# Patient Record
Sex: Female | Born: 1965 | Race: White | Hispanic: No | Marital: Single | Smoking: Never smoker
Health system: Southern US, Community
[De-identification: ages and names within clinical notes are randomized; demographics above are authoritative.]

## PROBLEM LIST (undated history)

## (undated) DIAGNOSIS — I10 Essential (primary) hypertension: Secondary | ICD-10-CM

## (undated) DIAGNOSIS — F32A Depression, unspecified: Secondary | ICD-10-CM

## (undated) DIAGNOSIS — F419 Anxiety disorder, unspecified: Secondary | ICD-10-CM

## (undated) DIAGNOSIS — E038 Other specified hypothyroidism: Secondary | ICD-10-CM

## (undated) DIAGNOSIS — E785 Hyperlipidemia, unspecified: Secondary | ICD-10-CM

## (undated) DIAGNOSIS — F329 Major depressive disorder, single episode, unspecified: Secondary | ICD-10-CM

## (undated) DIAGNOSIS — E039 Hypothyroidism, unspecified: Secondary | ICD-10-CM

## (undated) HISTORY — PX: TUBAL LIGATION: SHX77

## (undated) HISTORY — DX: Hyperlipidemia, unspecified: E78.5

## (undated) HISTORY — PX: SHOULDER SURGERY: SHX246

## (undated) HISTORY — DX: Depression, unspecified: F32.A

## (undated) HISTORY — DX: Essential (primary) hypertension: I10

## (undated) HISTORY — DX: Other specified hypothyroidism: E03.8

## (undated) HISTORY — DX: Hypothyroidism, unspecified: E03.9

## (undated) HISTORY — DX: Major depressive disorder, single episode, unspecified: F32.9

## (undated) HISTORY — DX: Anxiety disorder, unspecified: F41.9

---

## 1999-06-29 ENCOUNTER — Encounter: Payer: Self-pay | Admitting: Emergency Medicine

## 1999-06-29 ENCOUNTER — Emergency Department (HOSPITAL_COMMUNITY): Admission: EM | Admit: 1999-06-29 | Discharge: 1999-06-29 | Payer: Self-pay | Admitting: Emergency Medicine

## 1999-07-02 ENCOUNTER — Inpatient Hospital Stay (HOSPITAL_COMMUNITY): Admission: RE | Admit: 1999-07-02 | Discharge: 1999-07-02 | Payer: Self-pay | Admitting: Orthopedic Surgery

## 1999-07-02 ENCOUNTER — Encounter: Payer: Self-pay | Admitting: Orthopedic Surgery

## 2001-09-17 ENCOUNTER — Emergency Department (HOSPITAL_COMMUNITY): Admission: EM | Admit: 2001-09-17 | Discharge: 2001-09-17 | Payer: Self-pay | Admitting: Emergency Medicine

## 2001-10-03 ENCOUNTER — Emergency Department (HOSPITAL_COMMUNITY): Admission: EM | Admit: 2001-10-03 | Discharge: 2001-10-03 | Payer: Self-pay | Admitting: *Deleted

## 2001-11-09 ENCOUNTER — Encounter: Payer: Self-pay | Admitting: Emergency Medicine

## 2001-11-09 ENCOUNTER — Emergency Department (HOSPITAL_COMMUNITY): Admission: EM | Admit: 2001-11-09 | Discharge: 2001-11-09 | Payer: Self-pay | Admitting: Emergency Medicine

## 2002-10-23 ENCOUNTER — Ambulatory Visit (HOSPITAL_COMMUNITY): Admission: RE | Admit: 2002-10-23 | Discharge: 2002-10-23 | Payer: Self-pay | Admitting: *Deleted

## 2003-01-24 ENCOUNTER — Inpatient Hospital Stay (HOSPITAL_COMMUNITY): Admission: AD | Admit: 2003-01-24 | Discharge: 2003-01-24 | Payer: Self-pay | Admitting: *Deleted

## 2003-03-10 ENCOUNTER — Inpatient Hospital Stay (HOSPITAL_COMMUNITY): Admission: AD | Admit: 2003-03-10 | Discharge: 2003-03-14 | Payer: Self-pay | Admitting: Obstetrics & Gynecology

## 2006-09-10 ENCOUNTER — Emergency Department (HOSPITAL_COMMUNITY): Admission: EM | Admit: 2006-09-10 | Discharge: 2006-09-10 | Payer: Self-pay | Admitting: Emergency Medicine

## 2007-04-27 ENCOUNTER — Ambulatory Visit (HOSPITAL_COMMUNITY): Admission: RE | Admit: 2007-04-27 | Discharge: 2007-04-27 | Payer: Self-pay | Admitting: Family Medicine

## 2007-06-19 ENCOUNTER — Ambulatory Visit (HOSPITAL_COMMUNITY): Admission: RE | Admit: 2007-06-19 | Discharge: 2007-06-19 | Payer: Self-pay | Admitting: Family Medicine

## 2007-07-05 ENCOUNTER — Ambulatory Visit: Payer: Self-pay | Admitting: Obstetrics & Gynecology

## 2007-07-12 ENCOUNTER — Ambulatory Visit: Payer: Self-pay | Admitting: *Deleted

## 2007-07-16 ENCOUNTER — Ambulatory Visit (HOSPITAL_COMMUNITY): Admission: RE | Admit: 2007-07-16 | Discharge: 2007-07-16 | Payer: Self-pay | Admitting: Family Medicine

## 2007-07-16 ENCOUNTER — Ambulatory Visit: Payer: Self-pay | Admitting: Obstetrics & Gynecology

## 2007-07-19 ENCOUNTER — Ambulatory Visit: Payer: Self-pay | Admitting: Obstetrics & Gynecology

## 2007-07-23 ENCOUNTER — Ambulatory Visit (HOSPITAL_COMMUNITY): Admission: RE | Admit: 2007-07-23 | Discharge: 2007-07-23 | Payer: Self-pay | Admitting: Family Medicine

## 2007-07-23 ENCOUNTER — Ambulatory Visit: Payer: Self-pay | Admitting: Obstetrics & Gynecology

## 2007-07-26 ENCOUNTER — Ambulatory Visit: Payer: Self-pay | Admitting: *Deleted

## 2007-08-06 ENCOUNTER — Ambulatory Visit (HOSPITAL_COMMUNITY): Admission: RE | Admit: 2007-08-06 | Discharge: 2007-08-06 | Payer: Self-pay | Admitting: Family Medicine

## 2007-08-09 ENCOUNTER — Ambulatory Visit: Payer: Self-pay | Admitting: Family Medicine

## 2007-08-10 ENCOUNTER — Ambulatory Visit: Payer: Self-pay | Admitting: Obstetrics and Gynecology

## 2007-08-14 ENCOUNTER — Inpatient Hospital Stay (HOSPITAL_COMMUNITY): Admission: AD | Admit: 2007-08-14 | Discharge: 2007-08-17 | Payer: Self-pay | Admitting: Obstetrics & Gynecology

## 2007-08-14 ENCOUNTER — Ambulatory Visit: Payer: Self-pay | Admitting: Obstetrics and Gynecology

## 2007-08-15 ENCOUNTER — Encounter: Payer: Self-pay | Admitting: *Deleted

## 2007-08-16 ENCOUNTER — Encounter (INDEPENDENT_AMBULATORY_CARE_PROVIDER_SITE_OTHER): Payer: Self-pay | Admitting: Gynecology

## 2007-08-17 ENCOUNTER — Ambulatory Visit: Payer: Self-pay | Admitting: Physician Assistant

## 2007-08-17 ENCOUNTER — Inpatient Hospital Stay (HOSPITAL_COMMUNITY): Admission: AD | Admit: 2007-08-17 | Discharge: 2007-08-17 | Payer: Self-pay | Admitting: Family Medicine

## 2009-02-04 IMAGING — US US OB FOLLOW-UP
3 series · 14 of 28 positions shown · non-contrast
Comparison: none

OBSTETRICAL ULTRASOUND:
 This ultrasound was performed in The [HOSPITAL], and the [facility] report will be stored to [REDACTED] PACS.

[Series 1: us ob follow-up · 1 of 4 slices shown (1 of 3)]
[im 2/4]
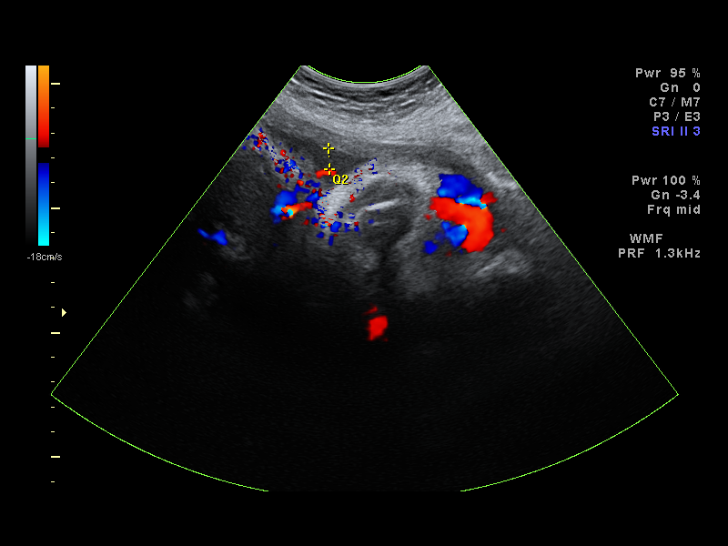

[Series 1: us ob follow-up · 12 of 30 slices shown (2 of 3)]
[im 1/30]
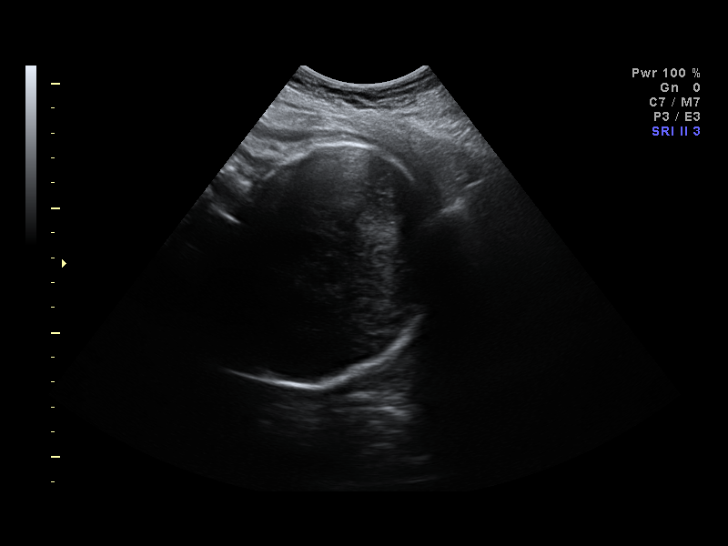
[im 3/30]
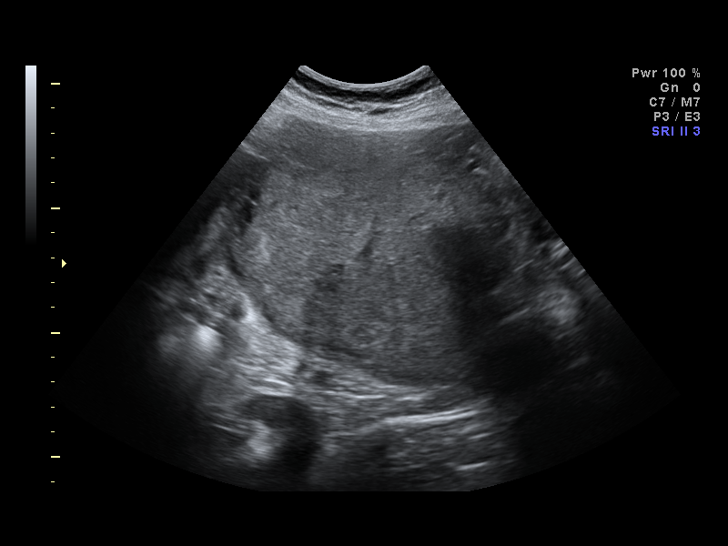
[im 6/30]
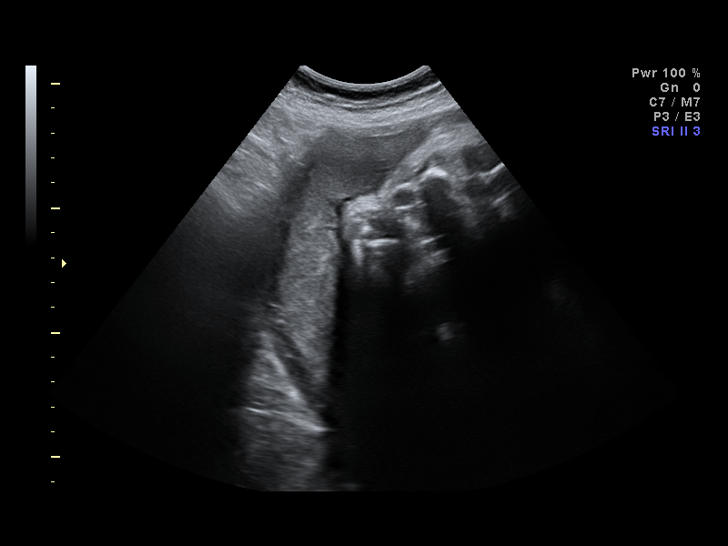
[im 8/30]
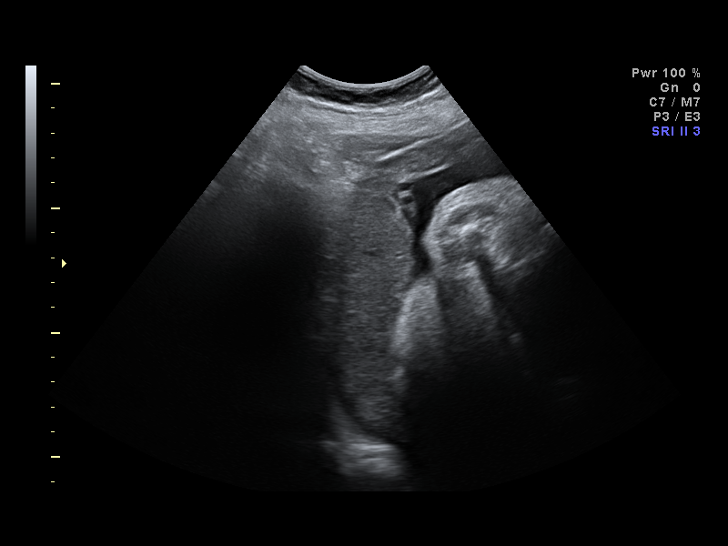
[im 11/30]
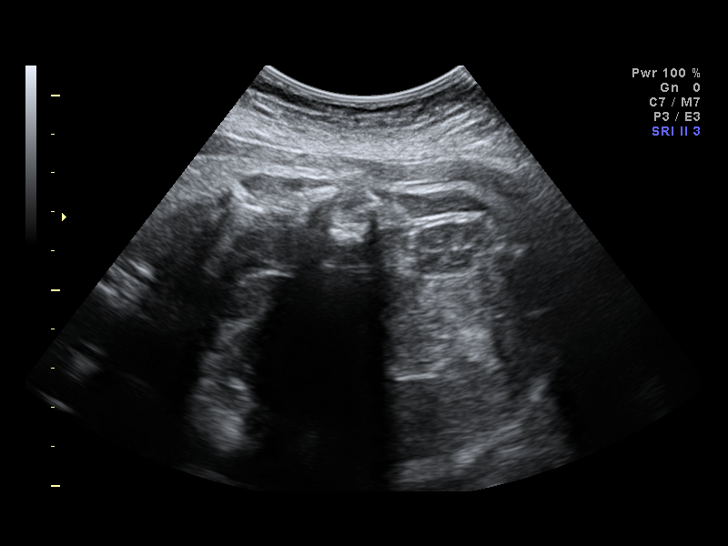
[im 14/30]
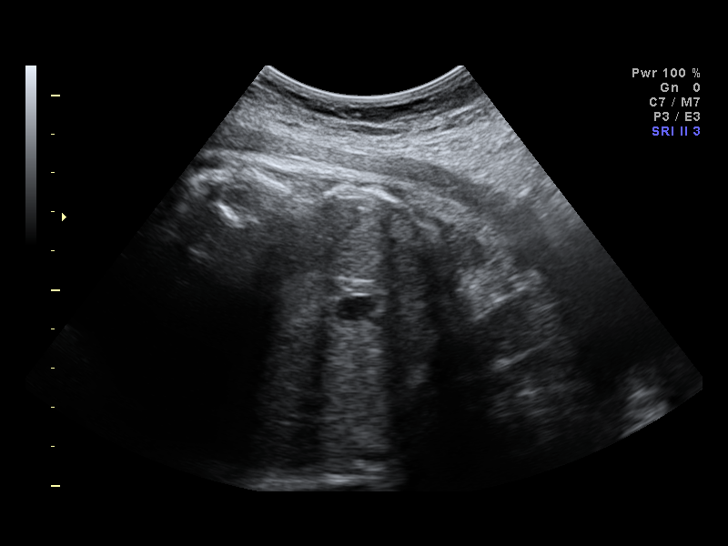
[im 16/30]
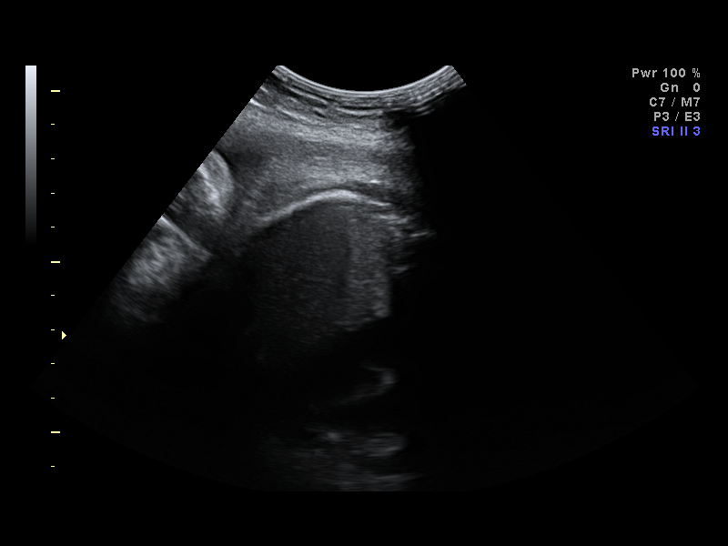
[im 19/30]
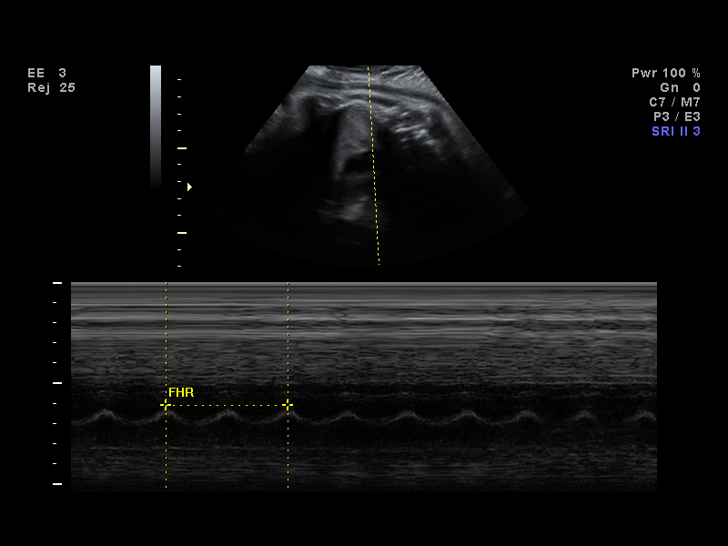
[im 22/30]
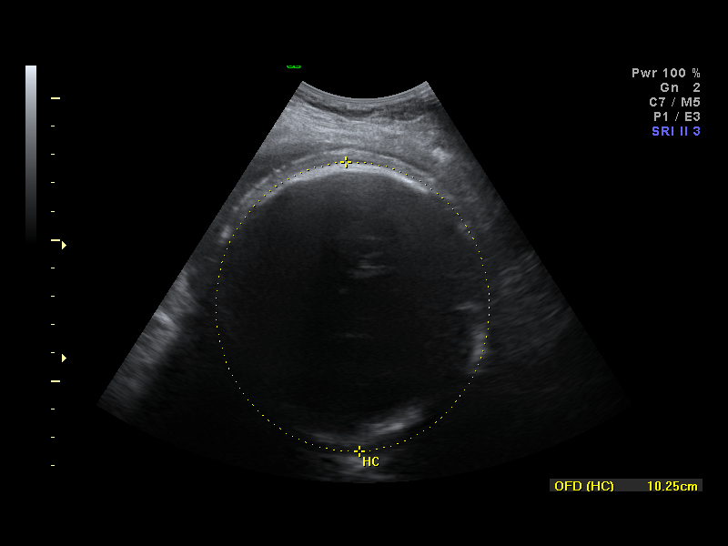
[im 24/30]
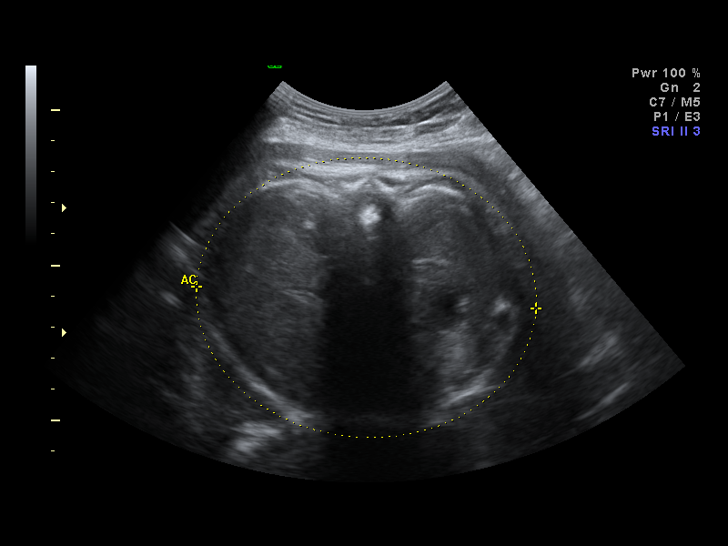
[im 27/30]
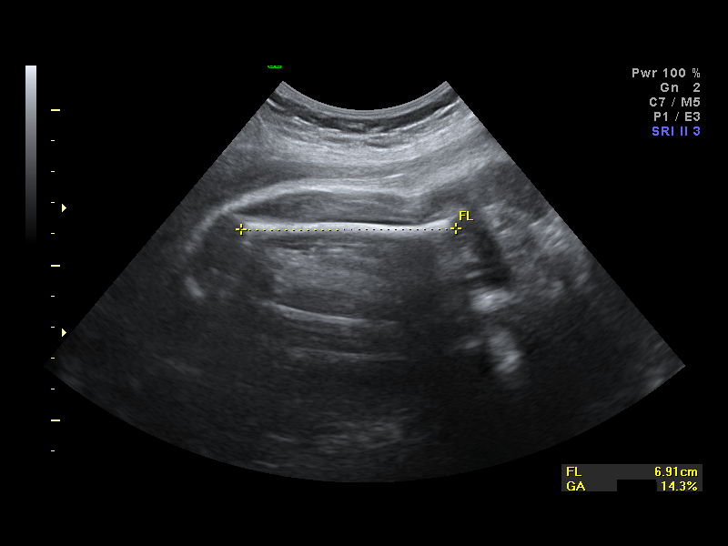
[im 30/30]
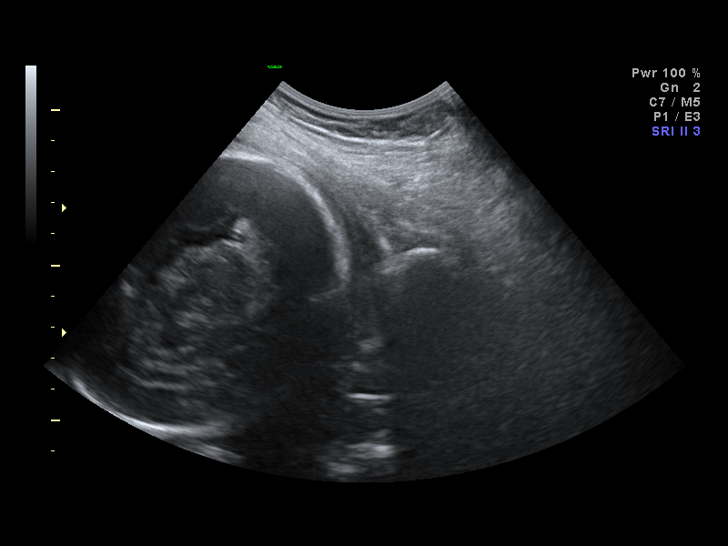

[Series 1: us ob follow-up · 1 of 3 slices shown (3 of 3)]
[im 3/3]
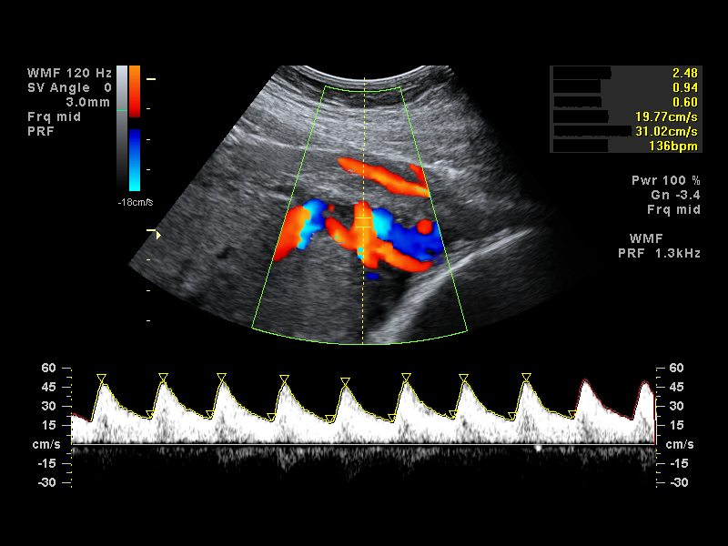

[14 of 28 positions shown; findings below may reference images not displayed]

IMPRESSION: [facility] has also been faxed to the ordering physician.

## 2009-09-06 ENCOUNTER — Emergency Department (HOSPITAL_COMMUNITY): Admission: EM | Admit: 2009-09-06 | Discharge: 2009-09-06 | Payer: Self-pay | Admitting: Emergency Medicine

## 2009-12-10 ENCOUNTER — Encounter: Admission: RE | Admit: 2009-12-10 | Discharge: 2009-12-10 | Payer: Self-pay | Admitting: Family Medicine

## 2010-03-21 ENCOUNTER — Emergency Department (HOSPITAL_COMMUNITY): Admission: EM | Admit: 2010-03-21 | Discharge: 2010-03-21 | Payer: Self-pay | Admitting: Emergency Medicine

## 2010-05-30 ENCOUNTER — Encounter: Payer: Self-pay | Admitting: *Deleted

## 2010-06-20 ENCOUNTER — Inpatient Hospital Stay (INDEPENDENT_AMBULATORY_CARE_PROVIDER_SITE_OTHER)
Admission: RE | Admit: 2010-06-20 | Discharge: 2010-06-20 | Disposition: A | Discharge status: Home / Self Care | Payer: Medicaid Other | Source: Ambulatory Visit | Attending: Emergency Medicine | Admitting: Emergency Medicine

## 2010-06-20 DIAGNOSIS — L509 Urticaria, unspecified: Secondary | ICD-10-CM

## 2010-09-21 NOTE — Discharge Summary (Signed)
[name], [name]                ACCOUNT NO.:  [id number]   MEDICAL RECORD NO.:  [id number]          PATIENT TYPE:  INP   LOCATION:  9372                          FACILITY:  [facility]   PHYSICIAN:  Allie Bossier, MD        DATE OF BIRTH:  1965-10-27   DATE OF ADMISSION:  08/14/2007  DATE OF DISCHARGE:  08/17/2007                               DISCHARGE SUMMARY   ADMISSION DIAGNOSES:  1. Intrauterine pregnancy at 36 weeks 6 days gestation.  2. Right upper quadrant pain.  3. Elevated diastolic blood pressures in the 90s.  4. History of pregnancy-induced hypertension in her last pregnancy.   DISCHARGE DIAGNOSES:  1. Postpartum day #1, spontaneous vaginal delivery of viable infant      female.  2. Postoperative day #1, bilateral tubal ligation.  3. Induction of labor for gestational hypertension and      oligohydramnios.  4. Postpartum hypertension, now on thiazide diuretic.   PROCEDURES:  1. [name] had an obstetric ultrasound performed on August 14, 2007,      for biophysical profile showing single intrauterine gestation in      cephalic presentation.  Placenta was posterior by the cervical os.      Biophysical profile was at 8.  Amniotic fluid index was 9.3.  2. She had a repeat ultrasound, complete OB ultrasound with MFM on      August 15, 2007, showing single gestation in cephalic presentation      with placenta right lateral above the cervical os.  Amniotic fluid      index was 6.65, this is of third percentile.  Estimated fetal      weight was 2689 g in 34th percentile.  3. The patient had a complete abdominal ultrasound for right upper      quadrant pain showing no gallbladder, liver, or pancreatic      abnormalities.  There was a 2-cm cyst in the upper pole of the left      kidney.   PROCEDURES:  The patient had a postpartum bilateral tubal ligation  performed by Dr. Mia Creek on August 16, 2007.   COMPLICATIONS:  The patient was admitted to the AICU for postpartum  magnesium  administration due to her elevated blood pressures and  symptoms consistent with preeclampsia including the right upper quadrant  pain.   PERTINENT LABORATORY FINDINGS:  Upon admission, [name] had a white  blood cell count of 10.9, hemoglobin 12.2, hematocrit 34.7, and  platelets 232.  Urinalysis was negative.  CMP showed sodium 136,  potassium 4.1, chloride 108, glucose 95, BUN 10, creatinine 0.73, AST  26, ALT 23, LDH was 114, and uric acid was 5.7.  On hospital day #2, the  patient had repeat PIH labs with hemoglobin 11.4, platelets 208, AST 17,  ALT 18, LDH was 74.  She had 24-hour urine collected that had a  creatinine clearance of 94, urine volume of 925, and total protein of  93.  On postpartum day #1, she had a white blood cell count of 16.9,  hemoglobin 10.3, platelets 196.  RPR  was nonreactive.   BRIEF PERTINENT ADMISSION HISTORY:  [name] is a 45 year old gravida  3, para 0-1-1-1 presenting to the MAU with right upper quadrant pain.  She also had elevated blood pressures but denied other symptoms of  preeclampsia.  She was evaluated with obstetrical ultrasound and found  to have a biophysical profile of 8/8 with an amniotic fluid index of  9.3.  Due to concern of her preeclampsia or possible gallbladder  disease, the patient was admitted for evaluation.   HOSPITAL COURSE:  The patient was admitted to the antenatal unit.  Gallbladder ultrasound was performed that showed no gallbladder, liver,  or pancreatic abnormality or pathology.  Her pressures were monitored  throughout her stay and they ranged from 139-161 systolic over 74-95  diastolic.  On hospital day #2, she had a repeat ultrasound showing  oligohydramnios with an AFI of 6.65.  Due to her elevated pressures and  the oligohydramnios, the patient was transferred to labor and delivery  for induction of labor.  Her 24-hour urine was not back at this time but  when it did return the protein was 93.  The patient did  progress to  spontaneous vaginal delivery of viable infant female weighing 5 pounds 7  ounces with Apgars 9 at 1 minute and 9 at 5 minutes on August 15, 2007, at  21:16 in the evening.  It was an uncomplicated delivery.  First-degree  perineal laceration was repaired with 3-0 Vicryl.  She was transferred  to the [facility] for continued magnesium supplementation which was begun when  she was induced.  On postpartum day #1, she had bilateral tubal ligation  performed by Dr. Mia Creek and tolerated that well.  On postpartum day  #2, her pressures were down to the 120s-140s range systolic 60s to 90s  diastolic.  She had been started on labetalol 200 b.i.d.  This was  switched to hydrochlorothiazide 12.5 mg before discharge.  She had no  signs or symptoms of preeclampsia.  She is doing well postoperatively.  She had mild tenderness without peritoneal signs.  She is ready for  discharge on postpartum day #2.  The baby was to stay due to decreased  weight and inability to breast-feed.  The mother was going to remain  with the baby.   DISCHARGE STATUS:  Stable.   DISCHARGE MEDICATIONS:  1. Continue prenatal vitamins 1 tablet daily.  2. Motrin 600 mg 1 tablet by mouth every 6 hours as needed for pain.  3. Percocet 5/25 1 tablet by mouth every 6 hours as needed for pain.  4. Ambien 5 mg 1 tablet by mouth at bedtime as needed for insomnia,      dispensed #5.  5. Colace 100 mg 1 tablet by mouth twice daily.  6. Hydrochlorothiazide 12.5 mg 1 tablet by mouth once daily.   DISCHARGE INSTRUCTIONS:  1. Discharged home.  2. No sexual activity for 6 weeks.  3. No lifting greater than 10 pounds for 6 weeks.  4. The patient is to have baby and nurse follow in 1 week to check      blood pressure.  She is to follow with the physician if her blood      pressures is greater than 140/90.  5. The patient is to follow at 6-week postpartum check at the health      department.      Karlton Lemon, MD   Electronically Signed     ______________________________  Allie Bossier, MD  NS/MEDQ  D:  08/17/2007  T:  08/18/2007  Job:  [id number]

## 2010-09-21 NOTE — Op Note (Signed)
NAMEHAELI, [name]                ACCOUNT NO.:  [id number]   MEDICAL RECORD NO.:  [id number]           PATIENT TYPE:   LOCATION:  MFM                           FACILITY:  [facility]   PHYSICIAN:  Ginger Carne, MD  DATE OF BIRTH:  Nov 07, 1965   DATE OF PROCEDURE:  DATE OF DISCHARGE:  08/17/2007                               OPERATIVE REPORT   PREOPERATIVE DIAGNOSIS:  Sterilization postpartum.   POSTOPERATIVE DIAGNOSIS:  Sterilization postpartum.   PROCEDURE:  [place] bilateral postpartum tubal ligation.   SURGEON:  Dr. Mia Creek.   ASSISTANT:  None.   COMPLICATIONS:  None immediate.   ESTIMATED BLOOD LOSS:  Minimal.   ANESTHESIA:  Epidural top off.   SPECIMEN:  Portions of right and left tubes disposed to pathology.   OPERATIVE FINDINGS:  Uterus, tubes and ovaries showed normal decidual  changes of pregnancy.  Both tubes were identified from their isthmus to  fimbriated ends separate and apart from their respective round  ligaments.   OPERATIVE PROCEDURE:  The patient was prepped and draped in the usual  fashion and placed in supine position.  Betadine solution used for  antiseptic and the patient was catheterized prior to the procedure.  Afterwards a vertical infraumbilical incision was made and the abdomen  opened.  Both tubes were identified from their isthmus to fimbriated  ends separate and apart from their respective round ligaments.  3 cm of  tubes were incorporated between 2-0 plain catgut suture.  The first  suture was affixed to the proximal portion of said tubes. The tubes cut  above the knots and tips cauterized, no active bleeding noted.  The  fascia closed in one layer of #0 Vicryl running suture and 3-0 Monocryl  for subcuticular closure.  Instrument and sponge count were correct.  The patient tolerated the procedure well and returned to the post  anesthesia recovery room in excellent condition.      Ginger Carne, MD  Electronically Signed     SHB/MEDQ  D:  08/16/2007  T:  08/16/2007  Job:  [id number]

## 2010-12-29 ENCOUNTER — Other Ambulatory Visit: Payer: Self-pay | Admitting: Family Medicine

## 2010-12-29 DIAGNOSIS — Z1231 Encounter for screening mammogram for malignant neoplasm of breast: Secondary | ICD-10-CM

## 2011-01-06 ENCOUNTER — Ambulatory Visit: Payer: Medicaid Other

## 2011-01-06 ENCOUNTER — Ambulatory Visit
Admission: RE | Admit: 2011-01-06 | Discharge: 2011-01-06 | Disposition: A | Discharge status: Home / Self Care | Payer: Medicaid Other | Source: Ambulatory Visit | Attending: Family Medicine | Admitting: Family Medicine

## 2011-01-06 DIAGNOSIS — Z1231 Encounter for screening mammogram for malignant neoplasm of breast: Secondary | ICD-10-CM

## 2011-01-28 LAB — POCT URINALYSIS DIP (DEVICE)
Bilirubin Urine: NEGATIVE
Glucose, UA: NEGATIVE
Nitrite: NEGATIVE

## 2011-01-31 LAB — POCT URINALYSIS DIP (DEVICE)
Bilirubin Urine: NEGATIVE
Bilirubin Urine: NEGATIVE
Glucose, UA: NEGATIVE
Glucose, UA: NEGATIVE
Glucose, UA: NEGATIVE
Nitrite: NEGATIVE
Nitrite: NEGATIVE
Nitrite: NEGATIVE
Operator id: 15968
Operator id: 297281
Operator id: 148111
pH: 6
pH: 5

## 2011-02-01 LAB — CBC
HCT: 32.5 — ABNORMAL LOW
Hemoglobin: 11.4 — ABNORMAL LOW
MCHC: 34.9
MCHC: 35
MCV: 93.1
MCV: 94.5
Platelets: 208
Platelets: 232
RDW: 13.3
RDW: 13.5
RDW: 13.6
WBC: 10.8 — ABNORMAL HIGH

## 2011-02-01 LAB — COMPREHENSIVE METABOLIC PANEL
ALT: 23
AST: 26
Calcium: 8.9
GFR calc Af Amer: >60
Sodium: 136
Total Protein: 5.2 — ABNORMAL LOW

## 2011-02-01 LAB — HEPATIC FUNCTION PANEL
ALT: 18
AST: 17
Alkaline Phosphatase: 111
Bilirubin, Direct: 0.1
Indirect Bilirubin: 0.3
Total Bilirubin: 0.4

## 2011-02-01 LAB — URINALYSIS, ROUTINE W REFLEX MICROSCOPIC
Bilirubin Urine: NEGATIVE
Ketones, ur: NEGATIVE
Nitrite: NEGATIVE
Specific Gravity, Urine: 1.02
Urobilinogen, UA: 0.2

## 2011-02-01 LAB — POCT URINALYSIS DIP (DEVICE)
Bilirubin Urine: NEGATIVE
Nitrite: NEGATIVE
pH: 6

## 2011-02-01 LAB — CREATININE CLEARANCE, URINE, 24 HOUR
Collection Interval-CRCL: 24
Creatinine Clearance: 94
Creatinine, Urine: 107.1

## 2011-02-01 LAB — PROTEIN, URINE, 24 HOUR: Urine Total Volume-UPROT: 925

## 2011-02-01 LAB — RPR: RPR Ser Ql: NONREACTIVE

## 2011-10-20 ENCOUNTER — Ambulatory Visit: Payer: Medicaid Other | Attending: Orthopaedic Surgery | Admitting: Rehabilitation

## 2011-11-08 ENCOUNTER — Ambulatory Visit: Payer: Medicaid Other | Attending: Orthopaedic Surgery | Admitting: Physical Therapy

## 2011-11-08 DIAGNOSIS — R262 Difficulty in walking, not elsewhere classified: Secondary | ICD-10-CM | POA: Insufficient documentation

## 2011-11-08 DIAGNOSIS — IMO0001 Reserved for inherently not codable concepts without codable children: Secondary | ICD-10-CM | POA: Insufficient documentation

## 2011-11-08 DIAGNOSIS — M25569 Pain in unspecified knee: Secondary | ICD-10-CM | POA: Insufficient documentation

## 2011-11-23 ENCOUNTER — Encounter: Payer: Medicaid Other | Admitting: Physical Therapy

## 2011-11-30 ENCOUNTER — Encounter: Payer: Medicaid Other | Admitting: Physical Therapy

## 2011-12-07 ENCOUNTER — Encounter: Payer: Medicaid Other | Admitting: Physical Therapy

## 2012-06-11 ENCOUNTER — Other Ambulatory Visit: Payer: Self-pay | Admitting: Family Medicine

## 2012-06-11 DIAGNOSIS — Z1231 Encounter for screening mammogram for malignant neoplasm of breast: Secondary | ICD-10-CM

## 2012-07-09 ENCOUNTER — Ambulatory Visit: Payer: Medicaid Other

## 2012-08-23 ENCOUNTER — Telehealth: Payer: Self-pay | Admitting: Family Medicine

## 2012-08-23 NOTE — Telephone Encounter (Signed)
Called patient and left mess that provider wants to see to address medications.  Told to call back and schedule appointment.

## 2012-08-23 NOTE — Telephone Encounter (Signed)
Needs to be seen to address.

## 2012-12-25 ENCOUNTER — Ambulatory Visit: Payer: Self-pay | Admitting: Family Medicine

## 2012-12-27 ENCOUNTER — Encounter: Payer: Self-pay | Admitting: Family Medicine

## 2012-12-27 ENCOUNTER — Ambulatory Visit (INDEPENDENT_AMBULATORY_CARE_PROVIDER_SITE_OTHER): Payer: Medicaid Other | Admitting: Family Medicine

## 2012-12-27 VITALS — BP 142/98 | HR 72 | Temp 98.0°F | Resp 16 | Wt 141.0 lb

## 2012-12-27 DIAGNOSIS — F419 Anxiety disorder, unspecified: Secondary | ICD-10-CM | POA: Insufficient documentation

## 2012-12-27 DIAGNOSIS — G4700 Insomnia, unspecified: Secondary | ICD-10-CM

## 2012-12-27 DIAGNOSIS — N912 Amenorrhea, unspecified: Secondary | ICD-10-CM

## 2012-12-27 DIAGNOSIS — K219 Gastro-esophageal reflux disease without esophagitis: Secondary | ICD-10-CM

## 2012-12-27 DIAGNOSIS — I10 Essential (primary) hypertension: Secondary | ICD-10-CM

## 2012-12-27 DIAGNOSIS — F329 Major depressive disorder, single episode, unspecified: Secondary | ICD-10-CM | POA: Insufficient documentation

## 2012-12-27 DIAGNOSIS — E785 Hyperlipidemia, unspecified: Secondary | ICD-10-CM | POA: Insufficient documentation

## 2012-12-27 LAB — PREGNANCY, URINE: Preg Test, Ur: NEGATIVE

## 2012-12-27 MED ORDER — TRAZODONE HCL 50 MG PO TABS: 25.0000 mg | ORAL_TABLET | Freq: Every evening | ORAL | Status: DC | PRN

## 2012-12-27 MED ORDER — LOSARTAN POTASSIUM 50 MG PO TABS: 50.0000 mg | ORAL_TABLET | Freq: Every day | ORAL | Status: DC

## 2012-12-27 MED ORDER — PANTOPRAZOLE SODIUM 40 MG PO TBEC: 40.0000 mg | DELAYED_RELEASE_TABLET | Freq: Every day | ORAL | Status: DC

## 2012-12-27 NOTE — Progress Notes (Signed)
  Subjective:    Patient ID: [name], female    DOB: [birth year], 47 y.o.   MRN: [id number]  HPI Patient has a history of hypertension. She is currently on hydrochlorothiazide 25 mg by mouth daily. She denies any chest pain shortness of breath or dyspnea on exertion. Her blood pressure continues to remain elevated. She also has hyperlipidemia on Zocor 20 mg by mouth daily. She denies myalgias, right upper quadrant pain. She continues to have problems with hot flashes. She has not had a period in 2 months. Prior to that she was having irregular periods which were very light. She is interested to see if she may be menopausal. She denies any intermenstrual bleeding.  She also reports severe reflux. It frequently awakens her from sleep. It is a substernal burning sensation. It is unrelieved by TUMS or Zantac. Past Medical History  Diagnosis Date  . Hypertension   . Hyperlipidemia   . Anxiety   . Depression    Past Surgical History  Procedure Laterality Date  . Tubal ligation     No current outpatient prescriptions on file prior to visit.   No current facility-administered medications on file prior to visit.   No Known Allergies History   Social History  . Marital Status: Single    Spouse Name: N/A    Number of Children: N/A  . Years of Education: N/A   Occupational History  . Not on file.   Social History Main Topics  . Smoking status: Never Smoker   . Smokeless tobacco: Not on file  . Alcohol Use: No  . Drug Use: No  . Sexual Activity: Not on file   Other Topics Concern  . Not on file   Social History Narrative  . No narrative on file     Review of Systems  All other systems reviewed and are negative.       Objective:   Physical Exam  Vitals reviewed. Cardiovascular: Normal rate, regular rhythm and normal heart sounds.   No murmur heard. Pulmonary/Chest: Effort normal and breath sounds normal. No respiratory distress. She has no wheezes. She has no rales.   Abdominal: Soft. Bowel sounds are normal. She exhibits no distension. There is no tenderness. There is no rebound and no guarding.          Assessment & Plan:  1. Amenorrhea Pregnancy test is negative. All check an FSH and LH to see if she may be menopausal. She is likely at least perimenopausal - Pregnancy, urine - Follicle stimulating hormone - Luteinizing hormone  2. HTN (hypertension) At losartan 50 mg by mouth daily. Recheck blood pressure in one month. Return fasting for a fasting lipid panel the - losartan (COZAAR) 50 MG tablet; Take 1 tablet (50 mg total) by mouth daily.  Dispense: 90 tablet; Refill: 3  3. Insomnia Try trazodone 50 mg by mouth each bedtime when necessary insomnia - traZODone (DESYREL) 50 MG tablet; Take 0.5-1 tablets (25-50 mg total) by mouth at bedtime as needed for sleep.  Dispense: 30 tablet; Refill: 3  4. GERD (gastroesophageal reflux disease) - pantoprazole (PROTONIX) 40 MG tablet; Take 1 tablet (40 mg total) by mouth daily.  Dispense: 30 tablet; Refill: 3

## 2013-03-27 ENCOUNTER — Other Ambulatory Visit: Payer: Self-pay

## 2013-03-27 DIAGNOSIS — Z1231 Encounter for screening mammogram for malignant neoplasm of breast: Secondary | ICD-10-CM

## 2013-04-09 ENCOUNTER — Encounter: Payer: Self-pay | Admitting: Family Medicine

## 2013-04-09 NOTE — Telephone Encounter (Signed)
[phone] call back number Pt is wondering since her Medicaid insurance has sent her something about having her mammogram done by January is that going to be okay to still do it on her apt on January 6th here at the office She would like to have someone call her back because she has also scheduled to have her mammogram done some where else

## 2013-04-12 NOTE — Telephone Encounter (Signed)
This encounter was created in error - please disregard.

## 2013-04-17 ENCOUNTER — Ambulatory Visit: Payer: Medicaid Other

## 2013-05-14 ENCOUNTER — Other Ambulatory Visit: Payer: Medicaid Other | Admitting: Family Medicine

## 2013-07-23 ENCOUNTER — Other Ambulatory Visit: Payer: Self-pay | Admitting: Family Medicine

## 2013-07-23 NOTE — Telephone Encounter (Signed)
One refill given  CPE next week

## 2013-07-30 ENCOUNTER — Other Ambulatory Visit: Payer: Medicaid Other | Admitting: Family Medicine

## 2013-09-17 ENCOUNTER — Other Ambulatory Visit: Payer: Self-pay | Admitting: Family Medicine

## 2013-09-17 NOTE — Telephone Encounter (Signed)
?   OK to Refill  

## 2013-09-17 NOTE — Telephone Encounter (Signed)
Refused, ntbs

## 2013-09-17 NOTE — Telephone Encounter (Signed)
Noted  

## 2013-09-20 ENCOUNTER — Other Ambulatory Visit: Payer: Self-pay | Admitting: Family Medicine

## 2013-09-20 NOTE — Telephone Encounter (Signed)
?   OK to Refill  

## 2013-09-23 NOTE — Telephone Encounter (Signed)
Denied, NTBS

## 2013-10-17 ENCOUNTER — Encounter: Payer: Medicaid Other | Admitting: Family Medicine

## 2013-10-29 ENCOUNTER — Encounter: Payer: Self-pay | Admitting: Family Medicine

## 2013-10-29 ENCOUNTER — Other Ambulatory Visit: Payer: Self-pay | Admitting: Family Medicine

## 2013-10-29 ENCOUNTER — Ambulatory Visit (INDEPENDENT_AMBULATORY_CARE_PROVIDER_SITE_OTHER): Payer: Medicaid Other | Admitting: Family Medicine

## 2013-10-29 VITALS — BP 146/90 | HR 78 | Temp 97.3°F | Resp 18 | Ht 63.0 in | Wt 144.0 lb

## 2013-10-29 DIAGNOSIS — Z0000 Encounter for general adult medical examination without abnormal findings: Secondary | ICD-10-CM

## 2013-10-29 DIAGNOSIS — L259 Unspecified contact dermatitis, unspecified cause: Secondary | ICD-10-CM

## 2013-10-29 DIAGNOSIS — E785 Hyperlipidemia, unspecified: Secondary | ICD-10-CM

## 2013-10-29 DIAGNOSIS — I10 Essential (primary) hypertension: Secondary | ICD-10-CM

## 2013-10-29 MED ORDER — MOMETASONE FUROATE 0.1 % EX OINT: TOPICAL_OINTMENT | Freq: Every day | CUTANEOUS | Status: DC

## 2013-10-29 NOTE — Progress Notes (Signed)
Subjective:    Patient ID: [name], female    DOB: 1966/03/06, 48 y.o.   MRN: [id number]  HPI Patient is here today for complete physical exam she is also complaining of decreased hearing in her right ear. Decreased hearing began one day ago. She is also having a rash all over her body. Is primarily in the bilateral axilla and on both eyes. It is primarily in sun exposed areas were she is applying a new suntan lotion. The rash is extremely itchy. It is not on her face. It is not on her abdomen or in her pelvic region. It appears to be a contact dermatitis. Her blood pressure today in the office is elevated. However she is been consistently checking her blood pressures home. The base of ranging 120 130/60-80. She denies any chest pain short of breath or dyspnea on exertion. She also denies any myalgias right quadrant pain. She is requesting a refill on trazodone for insomnia. She is scheduled for mammogram for August. Her last Pap smear was last year and was normal. She has no previous history of an abnormal Pap smear. Currently she is on a every 3 year screening cycle. Past Medical History  Diagnosis Date  . Hypertension   . Hyperlipidemia   . Anxiety   . Depression    Past Surgical History  Procedure Laterality Date  . Tubal ligation     Current Outpatient Prescriptions on File Prior to Visit  Medication Sig Dispense Refill  . hydrochlorothiazide (HYDRODIURIL) 25 MG tablet Take 25 mg by mouth daily.      Marland Kitchen. ibuprofen (ADVIL,MOTRIN) 800 MG tablet Take 800 mg by mouth every 8 (eight) hours as needed for pain.      Marland Kitchen. losartan (COZAAR) 50 MG tablet Take 1 tablet (50 mg total) by mouth daily.  90 tablet  3  . simvastatin (ZOCOR) 20 MG tablet Take 20 mg by mouth every evening.      . traZODone (DESYREL) 50 MG tablet TAKE 1/2 TO 1 TABLET BY MOUTH EVERY NIGHT AT BEDTIME AS NEEDED FOR SLEEP  30 tablet  0   No current facility-administered medications on file prior to visit.   No Known  Allergies History   Social History  . Marital Status: Single    Spouse Name: N/A    Number of Children: N/A  . Years of Education: N/A   Occupational History  . Not on file.   Social History Main Topics  . Smoking status: Never Smoker   . Smokeless tobacco: Not on file  . Alcohol Use: No  . Drug Use: No  . Sexual Activity: Not on file   Other Topics Concern  . Not on file   Social History Narrative  . No narrative on file   No family history on file.    Review of Systems  All other systems reviewed and are negative.      Objective:   Physical Exam  Vitals reviewed. Constitutional: She is oriented to person, place, and time. She appears well-developed and well-nourished. No distress.  HENT:  Head: Normocephalic and atraumatic.  Right Ear: External ear normal.  Left Ear: External ear normal.  Nose: Nose normal.  Mouth/Throat: Oropharynx is clear and moist. No oropharyngeal exudate.  Eyes: Conjunctivae and EOM are normal. Pupils are equal, round, and reactive to light. Right eye exhibits no discharge. Left eye exhibits no discharge. No scleral icterus.  Neck: Normal range of motion. Neck supple. No JVD present. No tracheal  deviation present. No thyromegaly present.  Cardiovascular: Normal rate, regular rhythm, normal heart sounds and intact distal pulses.  Exam reveals no gallop and no friction rub.   No murmur heard. Pulmonary/Chest: Effort normal and breath sounds normal. No stridor. No respiratory distress. She has no wheezes. She has no rales. She exhibits no tenderness.  Abdominal: Soft. Bowel sounds are normal. She exhibits no distension and no mass. There is no tenderness. There is no rebound and no guarding.  Musculoskeletal: Normal range of motion. She exhibits no edema and no tenderness.  Lymphadenopathy:    She has no cervical adenopathy.  Neurological: She is alert and oriented to person, place, and time. She has normal reflexes. She displays normal  reflexes. No cranial nerve deficit. She exhibits normal muscle tone. Coordination normal.  Skin: Skin is warm. Rash noted. She is not diaphoretic. No erythema. No pallor.  Psychiatric: She has a normal mood and affect. Her behavior is normal. Judgment and thought content normal.   diffuse red papular rash on both thighs. Both axillas. Lower back. And her neck. The rash is characterized by 2-3 mm erythematous papules.  Patient also has a cerumen impaction in her right ear which is easily removed with irrigation.      Assessment & Plan:  1. Contact dermatitis This is either solar urticaria or contact dermatitis due to her suntan lotion. Treat with elocon ointment applied once daily for one week. - mometasone (ELOCON) 0.1 % ointment; Apply topically daily.  Dispense: 45 g; Refill: 0  2. Routine general medical examination at a health care facility Physical exam is normal. Blood pressure slightly elevated today but normal at home. I will check a fasting lipid panel. LDL goal is less than 130.  Mammogram is scheduled. Pap smear is up-to-date.  Preventative care is up-to-date. - COMPLETE METABOLIC PANEL WITH GFR - Lipid panel - TSH - CBC with Differential  3. Essential hypertension See above - COMPLETE METABOLIC PANEL WITH GFR - Lipid panel - CBC with Differential  4. HLD (hyperlipidemia) Check fasting lipid panel. Goal LDL cholesterol is less than 130. - Lipid panel

## 2013-10-29 NOTE — Telephone Encounter (Signed)
Patient seen on 10/29/2013.  No fill for Tramadol noted in EPIC.   MD please advise.

## 2013-10-30 LAB — CBC WITH DIFFERENTIAL/PLATELET
BASOS ABS: 0 10*3/uL (ref 0.0–0.1)
BASOS PCT: 0 % (ref 0–1)
EOS ABS: 0.2 10*3/uL (ref 0.0–0.7)
Eosinophils Relative: 3 % (ref 0–5)
HCT: 40.5 % (ref 36.0–46.0)
Hemoglobin: 13.5 g/dL (ref 12.0–15.0)
Lymphocytes Relative: 34 % (ref 12–46)
Lymphs Abs: 2.1 10*3/uL (ref 0.7–4.0)
MCH: 31 pg (ref 26.0–34.0)
MCHC: 33.3 g/dL (ref 30.0–36.0)
MCV: 92.9 fL (ref 78.0–100.0)
Monocytes Absolute: 0.6 10*3/uL (ref 0.1–1.0)
Monocytes Relative: 10 % (ref 3–12)
NEUTROS PCT: 53 % (ref 43–77)
Neutro Abs: 3.3 10*3/uL (ref 1.7–7.7)
PLATELETS: 333 10*3/uL (ref 150–400)
RBC: 4.36 MIL/uL (ref 3.87–5.11)
RDW: 13.8 % (ref 11.5–15.5)
WBC: 6.2 10*3/uL (ref 4.0–10.5)

## 2013-10-30 LAB — COMPLETE METABOLIC PANEL WITH GFR
ALT: 16 U/L (ref 0–35)
AST: 21 U/L (ref 0–37)
Albumin: 3.9 g/dL (ref 3.5–5.2)
Alkaline Phosphatase: 30 U/L — ABNORMAL LOW (ref 39–117)
BUN: 11 mg/dL (ref 6–23)
CALCIUM: 9 mg/dL (ref 8.4–10.5)
CHLORIDE: 104 meq/L (ref 96–112)
CO2: 22 mEq/L (ref 19–32)
CREATININE: 0.97 mg/dL (ref 0.50–1.10)
GFR, Est African American: 82 mL/min
GFR, Est Non African American: 71 mL/min
Glucose, Bld: 92 mg/dL (ref 70–99)
POTASSIUM: 4.5 meq/L (ref 3.5–5.3)
Sodium: 136 mEq/L (ref 135–145)
Total Bilirubin: 0.8 mg/dL (ref 0.2–1.2)
Total Protein: 6.5 g/dL (ref 6.0–8.3)

## 2013-10-30 LAB — LIPID PANEL
Cholesterol: 229 mg/dL — ABNORMAL HIGH (ref 0–200)
HDL: 44 mg/dL (ref >39)
LDL CALC: 153 mg/dL — AB (ref 0–99)
TRIGLYCERIDES: 159 mg/dL — AB (ref <150)
Total CHOL/HDL Ratio: 5.2 Ratio
VLDL: 32 mg/dL (ref 0–40)

## 2013-10-30 LAB — TSH: TSH: 3.017 u[IU]/mL (ref 0.350–4.500)

## 2013-10-31 ENCOUNTER — Telehealth: Payer: Self-pay | Admitting: *Deleted

## 2013-10-31 ENCOUNTER — Other Ambulatory Visit: Payer: Self-pay | Admitting: Family Medicine

## 2013-10-31 MED ORDER — ROSUVASTATIN CALCIUM 20 MG PO TABS: 20.0000 mg | ORAL_TABLET | Freq: Every day | ORAL | Status: DC

## 2013-10-31 MED ORDER — TRAZODONE HCL 50 MG PO TABS: ORAL_TABLET | ORAL | Status: DC

## 2013-10-31 NOTE — Telephone Encounter (Signed)
Call placed to patient to inquire. LMTRC. 

## 2013-10-31 NOTE — Telephone Encounter (Signed)
?   OK to Refill  

## 2013-10-31 NOTE — Telephone Encounter (Signed)
ok 

## 2013-10-31 NOTE — Telephone Encounter (Signed)
Prescription sent to pharmacy.

## 2013-10-31 NOTE — Telephone Encounter (Signed)
What is she using tramadol for?  This is pain pill.  She can have her regular trazadone for sleep if this is what she ment.

## 2013-10-31 NOTE — Telephone Encounter (Signed)
Refill on traZODone (DESYREL) 50 MG tablet  Pharmacy [facility]

## 2013-11-04 ENCOUNTER — Telehealth: Payer: Self-pay | Admitting: Family Medicine

## 2013-11-04 NOTE — Telephone Encounter (Signed)
PA submitted through [employer] website and awaiting decision.

## 2013-11-05 MED ORDER — ROSUVASTATIN CALCIUM 20 MG PO TABS: 20.0000 mg | ORAL_TABLET | Freq: Every day | ORAL | Status: DC

## 2013-11-05 NOTE — Telephone Encounter (Signed)
Approved through 10/29/13 - pharmacy aware

## 2014-09-22 ENCOUNTER — Ambulatory Visit: Payer: Medicaid Other | Admitting: Family Medicine

## 2014-09-29 ENCOUNTER — Ambulatory Visit: Payer: Medicaid Other | Admitting: Family Medicine

## 2015-06-28 ENCOUNTER — Emergency Department (HOSPITAL_COMMUNITY)
Admission: EM | Admit: 2015-06-28 | Discharge: 2015-06-28 | Disposition: A | Discharge status: Home / Self Care | Payer: Medicaid Other | Attending: Emergency Medicine | Admitting: Emergency Medicine

## 2015-06-28 ENCOUNTER — Encounter (HOSPITAL_COMMUNITY): Payer: Self-pay | Admitting: Emergency Medicine

## 2015-06-28 DIAGNOSIS — E785 Hyperlipidemia, unspecified: Secondary | ICD-10-CM | POA: Insufficient documentation

## 2015-06-28 DIAGNOSIS — Z79899 Other long term (current) drug therapy: Secondary | ICD-10-CM | POA: Diagnosis not present

## 2015-06-28 DIAGNOSIS — J029 Acute pharyngitis, unspecified: Secondary | ICD-10-CM | POA: Diagnosis not present

## 2015-06-28 DIAGNOSIS — I10 Essential (primary) hypertension: Secondary | ICD-10-CM | POA: Insufficient documentation

## 2015-06-28 DIAGNOSIS — Z7952 Long term (current) use of systemic steroids: Secondary | ICD-10-CM | POA: Diagnosis not present

## 2015-06-28 DIAGNOSIS — F329 Major depressive disorder, single episode, unspecified: Secondary | ICD-10-CM | POA: Insufficient documentation

## 2015-06-28 DIAGNOSIS — F419 Anxiety disorder, unspecified: Secondary | ICD-10-CM | POA: Diagnosis not present

## 2015-06-28 DIAGNOSIS — G44209 Tension-type headache, unspecified, not intractable: Secondary | ICD-10-CM | POA: Insufficient documentation

## 2015-06-28 DIAGNOSIS — R51 Headache: Secondary | ICD-10-CM | POA: Diagnosis present

## 2015-06-28 LAB — RAPID STREP SCREEN (MED CTR MEBANE ONLY): Streptococcus, Group A Screen (Direct): NEGATIVE

## 2015-06-28 NOTE — Discharge Instructions (Signed)
Hypertension Hypertension, commonly called high blood pressure, is when the force of blood pumping through your arteries is too strong. Your arteries are the blood vessels that carry blood from your heart throughout your body. A blood pressure reading consists of a higher number over a lower number, such as 110/72. The higher number (systolic) is the pressure inside your arteries when your heart pumps. The lower number (diastolic) is the pressure inside your arteries when your heart relaxes. Ideally you want your blood pressure below 120/80. Hypertension forces your heart to work harder to pump blood. Your arteries may become narrow or stiff. Having untreated or uncontrolled hypertension can cause heart attack, stroke, kidney disease, and other problems. RISK FACTORS Some risk factors for high blood pressure are controllable. Others are not.  Risk factors you cannot control include:   Race. You may be at higher risk if you are African American.  Age. Risk increases with age.  Gender. Men are at higher risk than women before age 45 years. After age 65, women are at higher risk than men. Risk factors you can control include:  Not getting enough exercise or physical activity.  Being overweight.  Getting too much fat, sugar, calories, or salt in your diet.  Drinking too much alcohol. SIGNS AND SYMPTOMS Hypertension does not usually cause signs or symptoms. Extremely high blood pressure (hypertensive crisis) may cause headache, anxiety, shortness of breath, and nosebleed. DIAGNOSIS To check if you have hypertension, your health care provider will measure your blood pressure while you are seated, with your arm held at the level of your heart. It should be measured at least twice using the same arm. Certain conditions can cause a difference in blood pressure between your right and left arms. A blood pressure reading that is higher than normal on one occasion does not mean that you need treatment. If  it is not clear whether you have high blood pressure, you may be asked to return on a different day to have your blood pressure checked again. Or, you may be asked to monitor your blood pressure at home for 1 or more weeks. TREATMENT Treating high blood pressure includes making lifestyle changes and possibly taking medicine. Living a healthy lifestyle can help lower high blood pressure. You may need to change some of your habits. Lifestyle changes may include:  Following the DASH diet. This diet is high in fruits, vegetables, and whole grains. It is low in salt, red meat, and added sugars.  Keep your sodium intake below 2,300 mg per day.  Getting at least 30-45 minutes of aerobic exercise at least 4 times per week.  Losing weight if necessary.  Not smoking.  Limiting alcoholic beverages.  Learning ways to reduce stress. Your health care provider may prescribe medicine if lifestyle changes are not enough to get your blood pressure under control, and if one of the following is true:  You are 18-59 years of age and your systolic blood pressure is above 140.  You are 60 years of age or older, and your systolic blood pressure is above 150.  Your diastolic blood pressure is above 90.  You have diabetes, and your systolic blood pressure is over 140 or your diastolic blood pressure is over 90.  You have kidney disease and your blood pressure is above 140/90.  You have heart disease and your blood pressure is above 140/90. Your personal target blood pressure may vary depending on your medical conditions, your age, and other factors. HOME CARE INSTRUCTIONS    Have your blood pressure rechecked as directed by your health care provider.   °· Take medicines only as directed by your health care provider. Follow the directions carefully. Blood pressure medicines must be taken as prescribed. The medicine does not work as well when you skip doses. Skipping doses also puts you at risk for  problems. °· Do not smoke.   °· Monitor your blood pressure at home as directed by your health care provider.  °SEEK MEDICAL CARE IF:  °· You think you are having a reaction to medicines taken. °· You have recurrent headaches or feel dizzy. °· You have swelling in your ankles. °· You have trouble with your vision. °SEEK IMMEDIATE MEDICAL CARE IF: °· You develop a severe headache or confusion. °· You have unusual weakness, numbness, or feel faint. °· You have severe chest or abdominal pain. °· You vomit repeatedly. °· You have trouble breathing. °MAKE SURE YOU:  °· Understand these instructions. °· Will watch your condition. °· Will get help right away if you are not doing well or get worse. °  °This information is not intended to replace advice given to you by your health care provider. Make sure you discuss any questions you have with your health care provider. °  °Document Released: 04/25/2005 Document Revised: 09/09/2014 Document Reviewed: 02/15/2013 °Elsevier Interactive Patient Education ©2016 [employer] °Viral Infections °A virus is a type of germ. Viruses can cause: °· Minor sore throats. °· Aches and pains. °· Headaches. °· Runny nose. °· Rashes. °· Watery eyes. °· Tiredness. °· Coughs. °· Loss of appetite. °· Feeling sick to your stomach (nausea). °· Throwing up (vomiting). °· Watery poop (diarrhea). °HOME CARE  °· Only take medicines as told by your doctor. °· Drink enough water and fluids to keep your pee (urine) clear or pale yellow. Sports drinks are a good choice. °· Get plenty of rest and eat healthy. Soups and broths with crackers or rice are fine. °GET HELP RIGHT AWAY IF:  °· You have a very bad headache. °· You have shortness of breath. °· You have chest pain or neck pain. °· You have an unusual rash. °· You cannot stop throwing up. °· You have watery poop that does not stop. °· You cannot keep fluids down. °· You or your child has a temperature by mouth above 102° F (38.9° C), not controlled by  medicine. °· Your baby is older than 3 months with a rectal temperature of 102° F (38.9° C) or higher. °· Your baby is 3 months old or younger with a rectal temperature of 100.4° F (38° C) or higher. °MAKE SURE YOU:  °· Understand these instructions. °· Will watch this condition. °· Will get help right away if you are not doing well or get worse. °  °This information is not intended to replace advice given to you by your health care provider. Make sure you discuss any questions you have with your health care provider. °  °Document Released: 04/07/2008 Document Revised: 07/18/2011 Document Reviewed: 10/01/2014 °Elsevier Interactive Patient Education ©2016 [employer] ° °

## 2015-06-28 NOTE — ED Notes (Signed)
The patient said she started having a bad headache since last night.  She says she does have Hypertension and has not taken her medication.  She is also complaining of a sore throat.  She rates her pain 7/10.  She says it is not the worst headache she's ever had but she does think it is because her blood pressure is elevated.

## 2015-06-28 NOTE — ED Notes (Signed)
K. Sofia, PA, at bedside. 

## 2015-06-28 NOTE — ED Provider Notes (Signed)
CSN: [id number]     Arrival date & time 06/28/15  1931 History   First MD Initiated Contact with Patient 06/28/15 2008     Chief Complaint  Patient presents with  . Headache    The patient said she started having a bad headache since last night.  She says she does have Hypertension and has not taken her medication.  She is also complaining of a sore throat.    . Sore Throat     (Consider location/radiation/quality/duration/timing/severity/associated sxs/prior Treatment) Patient is a 50 y.o. female presenting with headaches and pharyngitis. The history is provided by the patient. No language interpreter was used.  Headache Pain location:  Generalized Quality:  Unable to specify Radiates to:  Does not radiate Severity currently:  7/10 Severity at highest:  7/10 Onset quality:  Gradual Duration:  1 day Timing:  Constant Progression:  Worsening Chronicity:  New Similar to prior headaches: no   Relieved by:  Nothing Worsened by:  Nothing Associated symptoms: no abdominal pain   Sore Throat Associated symptoms include headaches. Pertinent negatives include no abdominal pain.  Pt reports she has had a headache today and a sore throat.   Pt's mother has recently been sick. Past Medical History  Diagnosis Date  . Hypertension   . Hyperlipidemia   . Anxiety   . Depression    Past Surgical History  Procedure Laterality Date  . Tubal ligation     History reviewed. No pertinent family history. Social History  Substance Use Topics  . Smoking status: Never Smoker   . Smokeless tobacco: None  . Alcohol Use: No   OB History    No data available     Review of Systems  Gastrointestinal: Negative for abdominal pain.  Neurological: Positive for headaches.  All other systems reviewed and are negative.     Allergies  Review of patient's allergies indicates no known allergies.  Home Medications   Prior to Admission medications   Medication Sig Start Date End Date Taking?  Authorizing Provider  hydrochlorothiazide (HYDRODIURIL) 25 MG tablet Take 25 mg by mouth daily.    Historical Provider, MD  ibuprofen (ADVIL,MOTRIN) 800 MG tablet Take 800 mg by mouth every 8 (eight) hours as needed for pain.    Historical Provider, MD  losartan (COZAAR) 50 MG tablet Take 1 tablet (50 mg total) by mouth daily. 12/27/12   Donita Brooks, MD  mometasone (ELOCON) 0.1 % ointment Apply topically daily. 10/29/13   Donita Brooks, MD  rosuvastatin (CRESTOR) 20 MG tablet Take 1 tablet (20 mg total) by mouth daily. 11/05/13   Donita Brooks, MD  traZODone (DESYREL) 50 MG tablet TAKE 1/2 TO 1 TABLET BY MOUTH EVERY NIGHT AT BEDTIME AS NEEDED FOR SLEEP 10/31/13   Donita Brooks, MD   BP 128/74 mmHg  Pulse 66  Temp(Src) 98.3 F (36.8 C) (Oral)  Resp 16  SpO2 99% Physical Exam  Constitutional: She appears well-developed and well-nourished.  HENT:  Head: Normocephalic.  Right Ear: External ear normal.  Left Ear: External ear normal.  Mouth/Throat: Oropharynx is clear and moist.  Eyes: Conjunctivae are normal. Pupils are equal, round, and reactive to light.  Neck: Normal range of motion.  Cardiovascular: Normal rate.   Pulmonary/Chest: Effort normal.  Abdominal: Soft.  Neurological: She is alert.  Psychiatric: She has a normal mood and affect.  Nursing note and vitals reviewed.   ED Course  Procedures (including critical care time) Labs Review Labs Reviewed  RAPID  STREP SCREEN (NOT AT [facility])  CULTURE, GROUP A STREP [facility])    Imaging Review No results found. I have personally reviewed and evaluated these images and lab results as part of my medical decision-making.   EKG Interpretation None      MDM Pt's blood pressure normalized with rest.   I think headache and sore throat are viral.  I advised tylenol, encouraged fluids.  Pt advised to see Dr. Tanya Nones for recheck of blood pressure.    Final diagnoses:  Tension-type headache, not intractable, unspecified  chronicity pattern  Essential hypertension    An After Visit Summary was printed and given to the patient.    Lonia Skinner Shambaugh, PA-C 06/28/15 2057  Lavera Guise, MD 06/29/15 [phone]

## 2015-07-01 LAB — CULTURE, GROUP A STREP (THRC)

## 2015-12-22 ENCOUNTER — Encounter: Payer: Medicaid Other | Admitting: Family Medicine

## 2016-02-06 ENCOUNTER — Encounter (HOSPITAL_COMMUNITY): Payer: Self-pay | Admitting: Physical Medicine and Rehabilitation

## 2016-02-06 ENCOUNTER — Emergency Department (HOSPITAL_COMMUNITY)
Admission: EM | Admit: 2016-02-06 | Discharge: 2016-02-06 | Disposition: A | Discharge status: Home / Self Care | Payer: Medicaid Other | Attending: Emergency Medicine | Admitting: Emergency Medicine

## 2016-02-06 DIAGNOSIS — J029 Acute pharyngitis, unspecified: Secondary | ICD-10-CM | POA: Diagnosis not present

## 2016-02-06 DIAGNOSIS — I10 Essential (primary) hypertension: Secondary | ICD-10-CM | POA: Insufficient documentation

## 2016-02-06 LAB — RAPID STREP SCREEN (MED CTR MEBANE ONLY): STREPTOCOCCUS, GROUP A SCREEN (DIRECT): NEGATIVE

## 2016-02-06 MED ORDER — IBUPROFEN 100 MG/5ML PO SUSP: 400.0000 mg | Freq: Once | ORAL | Status: DC

## 2016-02-06 MED ORDER — IBUPROFEN 100 MG/5ML PO SUSP
600.0000 mg | Freq: Once | ORAL | Status: AC
  Administered 2016-02-06: 600 mg via ORAL
  Filled 2016-02-06: qty 30

## 2016-02-06 NOTE — Discharge Instructions (Signed)
Please read and follow all provided instructions.  Your diagnoses today include:  1. Viral pharyngitis    You appear to have an upper respiratory infection (URI). An upper respiratory tract infection, or cold, is a viral infection of the air passages leading to the lungs. It should improve gradually after 5-7 days. You may have a lingering cough that lasts for 2- 4 weeks after the infection.  Tests performed today include: Vital signs. See below for your results today.   Medications prescribed:   Take any prescribed medications only as directed. Treatment for your infection is aimed at treating the symptoms. There are no medications, such as antibiotics, that will cure your infection.   Home care instructions:  Follow any educational materials contained in this packet.   Your illness is contagious and can be spread to others, especially during the first 3 or 4 days. It cannot be cured by antibiotics or other medicines. Take basic precautions such as washing your hands often, covering your mouth when you cough or sneeze, and avoiding public places where you could spread your illness to others.   Please continue drinking plenty of fluids.  Use over-the-counter medicines as needed as directed on packaging for symptom relief.  You may also use ibuprofen or tylenol as directed on packaging for pain or fever.  Do not take multiple medicines containing Tylenol or acetaminophen to avoid taking too much of this medication.  Follow-up instructions: Please follow-up with your primary care provider in the next 3 days for further evaluation of your symptoms if you are not feeling better.   Return instructions:  Please return to the Emergency Department if you experience worsening symptoms.  RETURN IMMEDIATELY IF you develop shortness of breath, confusion or altered mental status, a new rash, become dizzy, faint, or poorly responsive, or are unable to be cared for at home. Please return if you have  persistent vomiting and cannot keep down fluids or develop a fever that is not controlled by tylenol or motrin.   Please return if you have any other emergent concerns.  Additional Information:  Your vital signs today were: BP (!) 158/110 (BP Location: Left Arm)    Pulse 84    Temp 97.8 F (36.6 C) (Oral)    Resp 20    SpO2 100%  If your blood pressure (BP) was elevated above 135/85 this visit, please have this repeated by your doctor within one month. --------------

## 2016-02-06 NOTE — ED Triage Notes (Signed)
Pt reports sore throat, sinus congestion, fever and R ear pain. Ongoing x2 days.

## 2016-02-06 NOTE — ED Provider Notes (Signed)
[facility]-EMERGENCY DEPT Provider Note   CSN: [id number] Arrival date & time: 02/06/16  [id number]  History   Chief [id number] Chief Complaint  Patient presents with  . Sore Throat    HPI Harvest [name] is a 50 y.o. female.  HPI  50 y.o. female with a hx of HTN, presents to the Emergency Department today complaining of sore throat, sinus congestion, and right ear pain x 2 days. Pt states that the pain in her right ear is 5/10. No tinnitus. No decrease hearing. Upon further questioning, pt does not have pain in ear itself, it is more along right cervical neck due to swollen lymph node. No N/V. Endorses one episode of diarrhea that has since resolved. No CP/SOB/ABD pain. Has occasional dry cough. Notes taking Dayquil with minimal relief. Pt states that she had a fever of 101F yesterday, but none now. Has not taken anything prior to arriving to ED. Able to tolerate PO. No other symptoms noted.   Past Medical History:  Diagnosis Date  . Anxiety   . Depression   . Hyperlipidemia   . Hypertension     Patient Active Problem List   Diagnosis Date Noted  . Hypertension   . Hyperlipidemia   . Anxiety   . Depression     Past Surgical History:  Procedure Laterality Date  . TUBAL LIGATION      OB History    No data available       Home Medications    Prior to Admission medications   Medication Sig Start Date End Date Taking? Authorizing Provider  hydrochlorothiazide (HYDRODIURIL) 25 MG tablet Take 25 mg by mouth daily.    Historical Provider, MD  ibuprofen (ADVIL,MOTRIN) 800 MG tablet Take 800 mg by mouth every 8 (eight) hours as needed for pain.    Historical Provider, MD  losartan (COZAAR) 50 MG tablet Take 1 tablet (50 mg total) by mouth daily. 12/27/12   Erin BrooksWarren T Pickard, MD  mometasone (ELOCON) 0.1 % ointment Apply topically daily. 10/29/13   Erin BrooksWarren T Pickard, MD  rosuvastatin (CRESTOR) 20 MG tablet Take 1 tablet (20 mg total) by mouth daily. 11/05/13   Erin BrooksWarren T Pickard, MD  traZODone  (DESYREL) 50 MG tablet TAKE 1/2 TO 1 TABLET BY MOUTH EVERY NIGHT AT BEDTIME AS NEEDED FOR SLEEP 10/31/13   Erin BrooksWarren T Pickard, MD    Family History No family history on file.  Social History Social History  Substance Use Topics  . Smoking status: Never Smoker  . Smokeless tobacco: Never Used  . Alcohol use No     Allergies   Review of patient's allergies indicates no known allergies.   Review of Systems Review of Systems  Constitutional: Positive for fever.  HENT: Positive for congestion, ear pain, rhinorrhea and sore throat.   Respiratory: Negative for shortness of breath.   Cardiovascular: Negative for chest pain.  Gastrointestinal: Negative for nausea and vomiting.  Allergic/Immunologic: Negative for immunocompromised state.   Physical Exam Updated Vital Signs BP (!) 158/110 (BP Location: Left Arm)   Pulse 84   Temp 97.8 F (36.6 C) (Oral)   Resp 20   SpO2 100%   Physical Exam  Constitutional: She is oriented to person, place, and time. Vital signs are normal. She appears well-developed and well-nourished.  Pt well appearing. Phonating well.   HENT:  Head: Normocephalic and atraumatic.  Right Ear: Hearing, tympanic membrane, external ear and ear canal normal.  Left Ear: Hearing, tympanic membrane, external ear and ear canal  normal.  Nose: Nose normal. Right sinus exhibits no maxillary sinus tenderness and no frontal sinus tenderness. Left sinus exhibits no maxillary sinus tenderness and no frontal sinus tenderness.  Mouth/Throat: Uvula is midline and mucous membranes are normal. No trismus in the jaw. No uvula swelling. Oropharyngeal exudate and posterior oropharyngeal erythema present. No tonsillar abscesses.  No Trismus. No Uvula Deviation. Full ROM of neck without difficulty.   Eyes: Conjunctivae and EOM are normal. Pupils are equal, round, and reactive to light.  Cardiovascular: Normal rate, regular rhythm, normal heart sounds and intact distal pulses.     Pulmonary/Chest: Effort normal and breath sounds normal. No respiratory distress. She has no wheezes. She has no rales. She exhibits no tenderness.  Abdominal: Soft.  Musculoskeletal: Normal range of motion.  Lymphadenopathy:    She has cervical adenopathy.       Right cervical: Superficial cervical adenopathy present.       Left cervical: No superficial cervical adenopathy present.  Neurological: She is alert and oriented to person, place, and time.  Skin: Skin is warm and dry.  Psychiatric: She has a normal mood and affect. Her speech is normal and behavior is normal. Thought content normal.  Nursing note and vitals reviewed.  ED Treatments / Results  Labs (all labs ordered are listed, but only abnormal results are displayed) Labs Reviewed  RAPID STREP SCREEN (NOT AT [facility])  CULTURE, GROUP A STREP [facility])    EKG  EKG Interpretation None       Radiology No results found.  Procedures Procedures (including critical care time)  Medications Ordered in ED Medications - No data to display   Initial Impression / Assessment and Plan / ED Course  I have reviewed the triage vital signs and the nursing notes.  Pertinent labs & imaging results that were available during my care of the patient were reviewed by me and considered in my medical decision making (see chart for details).  Clinical Course   Final Clinical Impressions(s) / ED Diagnoses  I have reviewed and evaluated the relevant laboratory values I have reviewed the relevant previous healthcare records. I obtained HPI from historian.  ED Course:  Assessment: Pt is a 49yF presents with URi symptoms x 2 days. Congestion, Sore throat, right ear pain . On exam, pt in NAD. VSS. Afebrile. Lungs CTA, Heart RRR. Abdomen nontender/soft. Strep test negative. Patients symptoms are consistent with URI, likely viral etiology. Discussed that antibiotics are not indicated for viral infections. Pt will be discharged with symptomatic  treatment.  Close follow up to PCP. Verbalizes understanding and is agreeable with plan. Pt is hemodynamically stable & in NAD prior to dc.  Disposition/Plan:  DC Home Additional Verbal discharge instructions given and discussed with patient.  Pt Instructed to f/u with PCP in the next week for evaluation and treatment of symptoms. Return precautions given Pt acknowledges and agrees with plan  Supervising Physician Linwood Dibbles, MD   Final diagnoses:  Viral pharyngitis    New Prescriptions New Prescriptions   No medications on file     Audry Pili, PA-C 02/06/16 [id number]    Linwood Dibbles, MD 02/07/16 [phone]

## 2016-02-06 NOTE — ED Notes (Signed)
Declined W/C at D/C and was escorted to lobby by RN. 

## 2016-02-09 LAB — CULTURE, GROUP A STREP (THRC)

## 2016-03-27 ENCOUNTER — Encounter (HOSPITAL_COMMUNITY): Payer: Self-pay | Admitting: Emergency Medicine

## 2016-03-27 ENCOUNTER — Emergency Department (HOSPITAL_COMMUNITY)
Admission: EM | Admit: 2016-03-27 | Discharge: 2016-03-27 | Disposition: A | Discharge status: Home / Self Care | Payer: Medicaid Other | Attending: Emergency Medicine | Admitting: Emergency Medicine

## 2016-03-27 ENCOUNTER — Emergency Department (HOSPITAL_COMMUNITY): Payer: Medicaid Other

## 2016-03-27 DIAGNOSIS — R0981 Nasal congestion: Secondary | ICD-10-CM | POA: Diagnosis present

## 2016-03-27 DIAGNOSIS — J40 Bronchitis, not specified as acute or chronic: Secondary | ICD-10-CM | POA: Diagnosis not present

## 2016-03-27 DIAGNOSIS — I10 Essential (primary) hypertension: Secondary | ICD-10-CM | POA: Insufficient documentation

## 2016-03-27 MED ORDER — PREDNISONE 10 MG (21) PO TBPK: 10.0000 mg | ORAL_TABLET | Freq: Every day | ORAL | 0 refills | Status: DC

## 2016-03-27 MED ORDER — BENZONATATE 100 MG PO CAPS: 100.0000 mg | ORAL_CAPSULE | Freq: Two times a day (BID) | ORAL | 0 refills | Status: DC | PRN

## 2016-03-27 NOTE — ED Notes (Signed)
Declined W/C at D/C and was escorted to lobby by RN. 

## 2016-03-27 NOTE — ED Triage Notes (Signed)
Pt. reports persistent productive cough with chest congestion , fatigue and  runny nose/nasal congestion for several weeks unrelieved by OTC medications .

## 2016-03-27 NOTE — Discharge Instructions (Signed)
Read the information below.  Use the prescribed medication as directed.  Please discuss all new medications with your pharmacist.  You may return to the Emergency Department at any time for worsening condition or any new symptoms that concern you.  If you develop high fevers that do not resolve with tylenol or ibuprofen, you have difficulty swallowing or breathing, or you are unable to tolerate fluids by mouth, return to the ER for a recheck.    °

## 2016-03-27 NOTE — ED Triage Notes (Signed)
Pt reports having a sore throat  But does not want a strep test done.

## 2016-03-27 NOTE — ED Provider Notes (Signed)
[facility] Provider Note   CSN: [id number] Arrival date & time: 03/27/16  [id number]     History   Chief Complaint Chief Complaint  Patient presents with  . Cough  . Nasal Congestion    HPI [name] is a 50 y.o. female.  HPI   Pt presents with 4 weeks of nasal congestion and cough.  Cough is persistent, occasionally productive of yellow sputum.  Has mild sore throat that has improved. Has used warm salt water gargles, tea with lemon, advil cold and sinus with little relief.  Denies recent fevers, SOB, CP, leg swelling.     Past Medical History:  Diagnosis Date  . Anxiety   . Depression   . Hyperlipidemia   . Hypertension     Patient Active Problem List   Diagnosis Date Noted  . Hypertension   . Hyperlipidemia   . Anxiety   . Depression     Past Surgical History:  Procedure Laterality Date  . SHOULDER SURGERY    . TUBAL LIGATION      OB History    No data available       Home Medications    Prior to Admission medications   Medication Sig Start Date End Date Taking? Authorizing Provider  benzonatate (TESSALON) 100 MG capsule Take 1 capsule (100 mg total) by mouth 2 (two) times daily as needed for cough. 03/27/16   Trixie DredgeEmily Avanni Turnbaugh, PA-C  hydrochlorothiazide (HYDRODIURIL) 25 MG tablet Take 25 mg by mouth daily.    Historical Provider, MD  ibuprofen (ADVIL,MOTRIN) 800 MG tablet Take 800 mg by mouth every 8 (eight) hours as needed for pain.    Historical Provider, MD  losartan (COZAAR) 50 MG tablet Take 1 tablet (50 mg total) by mouth daily. 12/27/12   Donita BrooksWarren T Pickard, MD  mometasone (ELOCON) 0.1 % ointment Apply topically daily. 10/29/13   Donita BrooksWarren T Pickard, MD  predniSONE (STERAPRED UNI-PAK 21 TAB) 10 MG (21) TBPK tablet Take 1 tablet (10 mg total) by mouth daily. Day 1: take 6 tabs.  Day 2: 5 tabs  Day 3: 4 tabs  Day 4: 3 tabs  Day 5: 2 tabs  Day 6: 1 tab 03/27/16   Trixie DredgeEmily Dacota Devall, PA-C  rosuvastatin (CRESTOR) 20 MG tablet Take 1 tablet (20 mg total) by mouth  daily. 11/05/13   Donita BrooksWarren T Pickard, MD  traZODone (DESYREL) 50 MG tablet TAKE 1/2 TO 1 TABLET BY MOUTH EVERY NIGHT AT BEDTIME AS NEEDED FOR SLEEP 10/31/13   Donita BrooksWarren T Pickard, MD    Family History No family history on file.  Social History Social History  Substance Use Topics  . Smoking status: Never Smoker  . Smokeless tobacco: Never Used  . Alcohol use No     Allergies   Patient has no known allergies.   Review of Systems Review of Systems  All other systems reviewed and are negative.    Physical Exam Updated Vital Signs BP 156/80 (BP Location: Right Arm)   Pulse 72   Temp 98 F (36.7 C) (Oral)   Resp 18   Ht 5\' 3"  (1.6 m)   Wt 60.8 kg   LMP 11/16/2010   SpO2 99%   BMI 23.74 kg/m   Physical Exam  Constitutional: She appears well-developed and well-nourished. No distress.  HENT:  Head: Normocephalic and atraumatic.  Mouth/Throat: Oropharynx is clear and moist. No oropharyngeal exudate.  Eyes: Conjunctivae are normal.  Neck: Neck supple.  Cardiovascular: Normal rate and regular rhythm.  Pulmonary/Chest: Effort normal and breath sounds normal. No respiratory distress. She has no wheezes. She has no rales.  Occasional cough  Neurological: She is alert.  Skin: She is not diaphoretic.  Nursing note and vitals reviewed.    ED Treatments / Results  Labs (all labs ordered are listed, but only abnormal results are displayed) Labs Reviewed - No data to display  EKG  EKG Interpretation None       Radiology Dg Chest 2 View  Result Date: 03/27/2016 CLINICAL DATA:  Productive cough EXAM: CHEST  2 VIEW COMPARISON:  None. FINDINGS: Normal heart size and mediastinal contours. No acute infiltrate or edema. No effusion or pneumothorax. Remote bilateral rib fractures. No acute osseous findings. IMPRESSION: No evidence of active disease. Electronically Signed   By: Marnee SpringJonathon  Watts M.D.   On: 03/27/2016 08:00    Procedures Procedures (including critical care  time)  Medications Ordered in ED Medications - No data to display   Initial Impression / Assessment and Plan / ED Course  I have reviewed the triage vital signs and the nursing notes.  Pertinent labs & imaging results that were available during my care of the patient were reviewed by me and considered in my medical decision making (see chart for details).  Clinical Course     Afebrile, nontoxic patient with constellation of symptoms suggestive of viral syndrome.  No concerning findings on exam.  CXR negative.  Discharged home with supportive care, prednisone, PCP follow up.  Discussed result, findings, treatment, and follow up  with patient.  Pt given return precautions.  Pt verbalizes understanding and agrees with plan.      Final Clinical Impressions(s) / ED Diagnoses   Final diagnoses:  Bronchitis    New Prescriptions Discharge Medication List as of 03/27/2016  8:20 AM    START taking these medications   Details  benzonatate (TESSALON) 100 MG capsule Take 1 capsule (100 mg total) by mouth 2 (two) times daily as needed for cough., Starting Sun 03/27/2016, Print    predniSONE (STERAPRED UNI-PAK 21 TAB) 10 MG (21) TBPK tablet Take 1 tablet (10 mg total) by mouth daily. Day 1: take 6 tabs.  Day 2: 5 tabs  Day 3: 4 tabs  Day 4: 3 tabs  Day 5: 2 tabs  Day 6: 1 tab, Starting Sun 03/27/2016, Print         AndoverEmily Markis Langland, PA-C 03/27/16 1055    Maia PlanJoshua G Long, MD 03/28/16 1332

## 2016-06-22 ENCOUNTER — Emergency Department (HOSPITAL_COMMUNITY)
Admission: EM | Admit: 2016-06-22 | Discharge: 2016-06-23 | Disposition: A | Discharge status: Home / Self Care | Payer: Medicaid Other | Attending: Emergency Medicine | Admitting: Emergency Medicine

## 2016-06-22 ENCOUNTER — Encounter (HOSPITAL_COMMUNITY): Payer: Self-pay | Admitting: *Deleted

## 2016-06-22 DIAGNOSIS — Z79899 Other long term (current) drug therapy: Secondary | ICD-10-CM | POA: Insufficient documentation

## 2016-06-22 DIAGNOSIS — K130 Diseases of lips: Secondary | ICD-10-CM | POA: Insufficient documentation

## 2016-06-22 DIAGNOSIS — R220 Localized swelling, mass and lump, head: Secondary | ICD-10-CM | POA: Diagnosis present

## 2016-06-22 DIAGNOSIS — I10 Essential (primary) hypertension: Secondary | ICD-10-CM | POA: Diagnosis not present

## 2016-06-22 MED ORDER — IBUPROFEN 800 MG PO TABS
800.0000 mg | ORAL_TABLET | Freq: Once | ORAL | Status: AC
  Administered 2016-06-23: 800 mg via ORAL
  Filled 2016-06-22: qty 1

## 2016-06-22 MED ORDER — CLINDAMYCIN HCL 150 MG PO CAPS
300.0000 mg | ORAL_CAPSULE | Freq: Once | ORAL | Status: AC
  Administered 2016-06-23: 300 mg via ORAL
  Filled 2016-06-22: qty 2

## 2016-06-22 NOTE — ED Triage Notes (Signed)
The pt is c/o her lower lip swelling  She had a pimple on her lower lips this am  She squeezed it and it is more swollen and red with pain.  lmp none

## 2016-06-22 NOTE — ED Provider Notes (Signed)
[facility] Provider Note   CSN: [id number] Arrival date & time: 06/22/16  2130     History   Chief Complaint Chief Complaint  Patient presents with  . Oral Swelling    HPI [name] is a 51 y.o. female.  HPI   51 year old female presenting complaining of pain and swelling to her lip. Patient reports yesterday she noticed a pimple to the bottom of her lip. She managed to pop it and since then she has noticed gradual onset of throbbing pain to her lower lip with associate swelling. Symptoms is moderate in severity, pain is persistent and now spreading towards her gum. She has partial denture and did report occasional pain to one of her front tooth. She also complaining of some sinus congestion. She tried some over-the-counter medication with minimal improvement. She denies swelling, trouble swallowing, neck pain, chest pain, shortness of breath or fever. Denies any recent medication changes or environmental changes. 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  Past Medical History:  Diagnosis Date  . Anxiety   . Depression   . Hyperlipidemia   . Hypertension     Patient Active Problem List   Diagnosis Date Noted  . Hypertension   . Hyperlipidemia   . Anxiety   . Depression     Past Surgical History:  Procedure Laterality Date  . SHOULDER SURGERY    . TUBAL LIGATION      OB History    No data available       Home Medications    Prior to Admission medications   Medication Sig Start Date End Date Taking? Authorizing Provider  benzonatate (TESSALON) 100 MG capsule Take 1 capsule (100 mg total) by mouth 2 (two) times daily as needed for cough. 03/27/16   Trixie DredgeEmily West, PA-C  hydrochlorothiazide (HYDRODIURIL) 25 MG tablet Take 25 mg by mouth daily.    Historical Provider, MD  ibuprofen (ADVIL,MOTRIN) 800 MG tablet Take 800 mg by mouth every 8 (eight) hours as needed for pain.    Historical Provider, MD  losartan (COZAAR) 50 MG tablet Take 1 tablet (50 mg total) by mouth  daily. 12/27/12   Donita BrooksWarren T Pickard, MD  mometasone (ELOCON) 0.1 % ointment Apply topically daily. 10/29/13   Donita BrooksWarren T Pickard, MD  predniSONE (STERAPRED UNI-PAK 21 TAB) 10 MG (21) TBPK tablet Take 1 tablet (10 mg total) by mouth daily. Day 1: take 6 tabs.  Day 2: 5 tabs  Day 3: 4 tabs  Day 4: 3 tabs  Day 5: 2 tabs  Day 6: 1 tab 03/27/16   Trixie DredgeEmily West, PA-C  rosuvastatin (CRESTOR) 20 MG tablet Take 1 tablet (20 mg total) by mouth daily. 11/05/13   Donita BrooksWarren T Pickard, MD  traZODone (DESYREL) 50 MG tablet TAKE 1/2 TO 1 TABLET BY MOUTH EVERY NIGHT AT BEDTIME AS NEEDED FOR SLEEP 10/31/13   Donita BrooksWarren T Pickard, MD    Family History No family history on file.  Social History Social History  Substance Use Topics  . Smoking status: Never Smoker  . Smokeless tobacco: Never Used  . Alcohol use No     Allergies   Patient has no known allergies.   Review of Systems Review of Systems  Constitutional: Negative for fever.  HENT: Positive for dental problem and facial swelling. Negative for trouble swallowing.   Neurological: Negative for numbness and headaches.     Physical Exam Updated Vital Signs BP 170/86   Pulse 72   Ht 5\' 3"  (1.6 m)  Wt 62.8 kg   LMP 11/16/2010   SpO2 98%   BMI 24.53 kg/m   Physical Exam  Constitutional: She appears well-developed and well-nourished. No distress.  HENT:  Head: Atraumatic.  Lips: Lower lip is moderately edematous and exquisitely tender to palpation. A pimple noted inferior to the lip.  Patient with partial denture, dental decay noted to tooth #26, tender to palpation.  Normal tone, no trismus.  Eyes: Conjunctivae are normal.  Neck: Normal range of motion. Neck supple. No tracheal deviation present. No thyromegaly present.  Neurological: She is alert.  Skin: No rash noted.  Psychiatric: She has a normal mood and affect.  Nursing note and vitals reviewed.    ED Treatments / Results  Labs (all labs ordered are listed, but only abnormal results  are displayed) Labs Reviewed - No data to display  EKG  EKG Interpretation None       Radiology No results found.  Procedures Procedures (including critical care time)  Medications Ordered in ED Medications - No data to display   Initial Impression / Assessment and Plan / ED Course  I have reviewed the triage vital signs and the nursing notes.  Pertinent labs & imaging results that were available during my care of the patient were reviewed by me and considered in my medical decision making (see chart for details).    BP 170/86   Pulse 72   Ht 5\' 3"  (1.6 m)   Wt 62.8 kg   LMP 11/16/2010   SpO2 98%   BMI 24.53 kg/m  The patient was noted to be hypertensive today in the emergency department. I have spoken with the patient regarding hypertension and the need for improved management. I instructed the patient to followup with the Primary care doctor within 4 days to improve the management of the patient's hypertension. I also counseled the patient regarding the signs and symptoms which would require an emergent visit to an emergency department for hypertensive urgency and/or hypertensive emergency. The patient understood the need for improved hypertensive management.   Final Clinical Impressions(s) / ED Diagnoses   Final diagnoses:  Infection of lip    New Prescriptions New Prescriptions   CLINDAMYCIN (CLEOCIN) 150 MG CAPSULE    Take 1 capsule (150 mg total) by mouth every 6 (six) hours.   IBUPROFEN (ADVIL,MOTRIN) 800 MG TABLET    Take 1 tablet (800 mg total) by mouth 3 (three) times daily.   11:53 PM Patient here with pain and swelling to her lower lip after she pop a pimple. It does appears infected. No obvious abscess amenable for drainage at this time. Doubt deep tissue infection. Doubt angioedema from allergic reaction. Plan to treat with NSAIDs, and antibiotic. Encouraged patient to follow-up with a dentist. Return in 48 hours if no improvement.   Fayrene Helper,  PA-C 06/23/16 1610    Alvira Monday, MD 06/27/16 [phone]

## 2016-06-22 NOTE — ED Notes (Signed)
Ice pack given to pt.

## 2016-06-22 NOTE — ED Notes (Signed)
Pt has swelling to lower lip after trying to pop a pimple this am

## 2016-06-23 MED ORDER — IBUPROFEN 800 MG PO TABS: 800.0000 mg | ORAL_TABLET | Freq: Three times a day (TID) | ORAL | 0 refills | Status: DC

## 2016-06-23 MED ORDER — CLINDAMYCIN HCL 150 MG PO CAPS
150.0000 mg | ORAL_CAPSULE | Freq: Four times a day (QID) | ORAL | 0 refills | Status: DC
Start: 2016-06-23 — End: 2016-08-09

## 2016-06-23 NOTE — Discharge Instructions (Signed)
You have been diagnosed with infection of the lip.  This can potentially form an abscess.  Apply compress several times daily.  Take antibiotic as prescribe for the full duration.  Take ibuprofen as needed for pain.  Return in 48 hrs for recheck if your condition worsen.  Follow up with your dentist for further care.

## 2016-08-09 ENCOUNTER — Ambulatory Visit (INDEPENDENT_AMBULATORY_CARE_PROVIDER_SITE_OTHER): Payer: Medicaid Other | Admitting: Family Medicine

## 2016-08-09 ENCOUNTER — Encounter: Payer: Self-pay | Admitting: Family Medicine

## 2016-08-09 ENCOUNTER — Other Ambulatory Visit: Payer: Self-pay | Admitting: Family Medicine

## 2016-08-09 VITALS — BP 138/84 | HR 66 | Temp 97.6°F | Resp 14 | Wt 139.0 lb

## 2016-08-09 DIAGNOSIS — K5909 Other constipation: Secondary | ICD-10-CM | POA: Diagnosis not present

## 2016-08-09 DIAGNOSIS — I10 Essential (primary) hypertension: Secondary | ICD-10-CM

## 2016-08-09 DIAGNOSIS — E7800 Pure hypercholesterolemia, unspecified: Secondary | ICD-10-CM | POA: Diagnosis not present

## 2016-08-09 NOTE — Progress Notes (Signed)
Subjective:    Patient ID: [name], female    DOB: 1965-09-03, 51 y.o.   MRN: [id number]  HPI I have not seen the patient in more than 2 years. She has a history of hypertension and hyperlipidemia. She has not been on any medication for many many months. Her blood pressure today is borderline.  She denies any chest pain shortness of breath or dyspnea on exertion. She is not had her cholesterol checked in several years. She states that over the last month, she's been unable to defecate. She goes 2-3 days without having a bowel movement. She has been forced to take a laxative by mouth with a laxative she will have a small mushy amount of stool but she is not having complete evacuation. She reports pelvic pressure and discomfort on a daily basis without having a bowel movement. She is tried MiraLAX, Dulcolax, Ex-Lax, and milk of magnesia without benefit other than some mushy small amount of loose stool. Past Medical History:  Diagnosis Date  . Anxiety   . Depression   . Hyperlipidemia   . Hypertension    Past Surgical History:  Procedure Laterality Date  . SHOULDER SURGERY    . TUBAL LIGATION     Current Outpatient Prescriptions on File Prior to Visit  Medication Sig Dispense Refill  . losartan (COZAAR) 50 MG tablet Take 1 tablet (50 mg total) by mouth daily. (Patient not taking: Reported on 08/09/2016) 90 tablet 3  . rosuvastatin (CRESTOR) 20 MG tablet Take 1 tablet (20 mg total) by mouth daily. (Patient not taking: Reported on 08/09/2016) 30 tablet 3  . traZODone (DESYREL) 50 MG tablet TAKE 1/2 TO 1 TABLET BY MOUTH EVERY NIGHT AT BEDTIME AS NEEDED FOR SLEEP (Patient not taking: Reported on 08/09/2016) 30 tablet 0   No current facility-administered medications on file prior to visit.    No Known Allergies Social History   Social History  . Marital status: Single    Spouse name: N/A  . Number of children: N/A  . Years of education: N/A   Occupational History  . Not on file.    Social History Main Topics  . Smoking status: Never Smoker  . Smokeless tobacco: Never Used  . Alcohol use No  . Drug use: No  . Sexual activity: Not on file   Other Topics Concern  . Not on file   Social History Narrative  . No narrative on file      Review of Systems  All other systems reviewed and are negative.      Objective:   Physical Exam  Constitutional: She appears well-developed and well-nourished. No distress.  Cardiovascular: Normal rate, regular rhythm and normal heart sounds.   Pulmonary/Chest: Effort normal and breath sounds normal. No respiratory distress. She has no wheezes. She has no rales.  Abdominal: Soft. Bowel sounds are normal. She exhibits no distension. There is no tenderness. There is no rebound and no guarding.  Musculoskeletal: She exhibits no edema.  Skin: She is not diaphoretic.  Vitals reviewed.         Assessment & Plan:  Chronic constipation - Plan: COMPLETE METABOLIC PANEL WITH GFR, CBC with Differential/Platelet, TSH  Essential hypertension  Pure hypercholesterolemia  Given her chronic constipation, will check a TSH to rule out hypothyroidism. I will start the patient on Linzess 145 mg poqday.  I have asked the patient use a fleets enema once daily until she has had excessively evacuation of her bowels. Blood pressure today  is borderline. I've asked the patient check blood pressure on a daily basis and notify me of the values in one month. Goal blood pressures less than 140/90. I've also asked the patient return fasting for fasting lipid panel to determine if she still needs statin medication

## 2016-08-10 LAB — COMPLETE METABOLIC PANEL WITH GFR
ALBUMIN: 3.8 g/dL (ref 3.6–5.1)
ALT: 18 U/L (ref 6–29)
AST: 19 U/L (ref 10–35)
Alkaline Phosphatase: 32 U/L — ABNORMAL LOW (ref 33–115)
BILIRUBIN TOTAL: 0.2 mg/dL (ref 0.2–1.2)
BUN: 13 mg/dL (ref 7–25)
CHLORIDE: 106 mmol/L (ref 98–110)
CO2: 26 mmol/L (ref 20–31)
Calcium: 9 mg/dL (ref 8.6–10.2)
Creat: 0.82 mg/dL (ref 0.50–1.10)
GFR, EST NON AFRICAN AMERICAN: 85 mL/min (ref >=60)
GFR, Est African American: >89 mL/min (ref >=60)
Glucose, Bld: 91 mg/dL (ref 70–99)
POTASSIUM: 4.7 mmol/L (ref 3.5–5.3)
SODIUM: 140 mmol/L (ref 135–146)
Total Protein: 6.6 g/dL (ref 6.1–8.1)

## 2016-08-10 LAB — CBC WITH DIFFERENTIAL/PLATELET
BASOS PCT: 0 %
Basophils Absolute: 0 cells/uL (ref 0–200)
EOS ABS: 174 {cells}/uL (ref 15–500)
EOS PCT: 2 %
HCT: 39.8 % (ref 35.0–45.0)
Hemoglobin: 13.1 g/dL (ref 12.0–15.0)
Lymphocytes Relative: 31 %
Lymphs Abs: 2697 cells/uL (ref 850–3900)
MCH: 30.4 pg (ref 27.0–33.0)
MCHC: 32.9 g/dL (ref 32.0–36.0)
MCV: 92.3 fL (ref 80.0–100.0)
MPV: 10 fL (ref 7.5–12.5)
Monocytes Absolute: 696 cells/uL (ref 200–950)
Monocytes Relative: 8 %
NEUTROS ABS: 5133 {cells}/uL (ref 1500–7800)
Neutrophils Relative %: 59 %
PLATELETS: 307 10*3/uL (ref 140–400)
RBC: 4.31 MIL/uL (ref 3.80–5.10)
RDW: 13 % (ref 11.0–15.0)
WBC: 8.7 10*3/uL (ref 3.8–10.8)

## 2016-08-10 LAB — TSH: TSH: 5.69 m[IU]/L — AB

## 2016-08-12 ENCOUNTER — Encounter: Payer: Self-pay | Admitting: Family Medicine

## 2016-08-12 DIAGNOSIS — R7989 Other specified abnormal findings of blood chemistry: Secondary | ICD-10-CM | POA: Insufficient documentation

## 2016-08-13 LAB — T4, FREE: FREE T4: 0.9 ng/dL (ref 0.8–1.8)

## 2016-08-13 LAB — T3, FREE: T3 FREE: 2.7 pg/mL (ref 2.3–4.2)

## 2016-08-15 ENCOUNTER — Encounter: Payer: Self-pay | Admitting: Family Medicine

## 2016-08-16 ENCOUNTER — Other Ambulatory Visit: Payer: Self-pay | Admitting: Family Medicine

## 2016-08-16 DIAGNOSIS — E038 Other specified hypothyroidism: Secondary | ICD-10-CM

## 2016-08-16 DIAGNOSIS — E039 Hypothyroidism, unspecified: Secondary | ICD-10-CM

## 2016-11-18 ENCOUNTER — Encounter (HOSPITAL_COMMUNITY): Payer: Self-pay | Admitting: Emergency Medicine

## 2016-11-18 ENCOUNTER — Emergency Department (HOSPITAL_COMMUNITY): Payer: Medicaid Other

## 2016-11-18 ENCOUNTER — Emergency Department (HOSPITAL_COMMUNITY)
Admission: EM | Admit: 2016-11-18 | Discharge: 2016-11-18 | Disposition: A | Discharge status: Home / Self Care | Payer: Medicaid Other | Attending: Emergency Medicine | Admitting: Emergency Medicine

## 2016-11-18 DIAGNOSIS — E02 Subclinical iodine-deficiency hypothyroidism: Secondary | ICD-10-CM | POA: Diagnosis not present

## 2016-11-18 DIAGNOSIS — I10 Essential (primary) hypertension: Secondary | ICD-10-CM | POA: Insufficient documentation

## 2016-11-18 DIAGNOSIS — K5720 Diverticulitis of large intestine with perforation and abscess without bleeding: Secondary | ICD-10-CM

## 2016-11-18 DIAGNOSIS — K6289 Other specified diseases of anus and rectum: Secondary | ICD-10-CM | POA: Insufficient documentation

## 2016-11-18 DIAGNOSIS — K5900 Constipation, unspecified: Secondary | ICD-10-CM | POA: Diagnosis present

## 2016-11-18 DIAGNOSIS — Z79899 Other long term (current) drug therapy: Secondary | ICD-10-CM | POA: Insufficient documentation

## 2016-11-18 LAB — CBC WITH DIFFERENTIAL/PLATELET
Basophils Absolute: 0 10*3/uL (ref 0.0–0.1)
Basophils Relative: 0 %
Eosinophils Absolute: 0.1 10*3/uL (ref 0.0–0.7)
Eosinophils Relative: 1 %
HCT: 38.3 % (ref 36.0–46.0)
HEMOGLOBIN: 12.5 g/dL (ref 12.0–15.0)
LYMPHS ABS: 1.6 10*3/uL (ref 0.7–4.0)
Lymphocytes Relative: 17 %
MCH: 30.4 pg (ref 26.0–34.0)
MCHC: 32.6 g/dL (ref 30.0–36.0)
MCV: 93.2 fL (ref 78.0–100.0)
MONOS PCT: 8 %
Monocytes Absolute: 0.7 10*3/uL (ref 0.1–1.0)
NEUTROS PCT: 74 %
Neutro Abs: 7.1 10*3/uL (ref 1.7–7.7)
Platelets: 324 10*3/uL (ref 150–400)
RBC: 4.11 MIL/uL (ref 3.87–5.11)
RDW: 12.7 % (ref 11.5–15.5)
WBC: 9.5 10*3/uL (ref 4.0–10.5)

## 2016-11-18 LAB — BASIC METABOLIC PANEL
ANION GAP: 12 (ref 5–15)
BUN: 8 mg/dL (ref 6–20)
CO2: 22 mmol/L (ref 22–32)
Calcium: 8.9 mg/dL (ref 8.9–10.3)
Chloride: 101 mmol/L (ref 101–111)
Creatinine, Ser: 0.91 mg/dL (ref 0.44–1.00)
GFR calc non Af Amer: >60 mL/min (ref >60)
Glucose, Bld: 116 mg/dL — ABNORMAL HIGH (ref 65–99)
Potassium: 3.6 mmol/L (ref 3.5–5.1)
Sodium: 135 mmol/L (ref 135–145)

## 2016-11-18 MED ORDER — AMOXICILLIN-POT CLAVULANATE 500-125 MG PO TABS: 1.0000 | ORAL_TABLET | Freq: Three times a day (TID) | ORAL | 0 refills | Status: DC

## 2016-11-18 MED ORDER — HYDROCODONE-ACETAMINOPHEN 5-325 MG PO TABS: 1.0000 | ORAL_TABLET | Freq: Four times a day (QID) | ORAL | 0 refills | Status: DC | PRN

## 2016-11-18 MED ORDER — ACETAMINOPHEN 500 MG PO TABS
1000.0000 mg | ORAL_TABLET | Freq: Once | ORAL | Status: AC
  Administered 2016-11-18: 1000 mg via ORAL
  Filled 2016-11-18: qty 2

## 2016-11-18 MED ORDER — MORPHINE SULFATE (PF) 4 MG/ML IV SOLN
4.0000 mg | Freq: Once | INTRAVENOUS | Status: AC
  Administered 2016-11-18: 4 mg via INTRAVENOUS
  Filled 2016-11-18: qty 1

## 2016-11-18 MED ORDER — IOPAMIDOL (ISOVUE-300) INJECTION 61%
INTRAVENOUS | Status: AC
  Administered 2016-11-18: 100 mL
  Filled 2016-11-18: qty 100

## 2016-11-18 MED ORDER — PIPERACILLIN-TAZOBACTAM 3.375 G IVPB
3.3750 g | Freq: Once | INTRAVENOUS | Status: AC
  Administered 2016-11-18: 3.375 g via INTRAVENOUS
  Filled 2016-11-18: qty 50

## 2016-11-18 MED ORDER — ONDANSETRON HCL 4 MG/2ML IJ SOLN
4.0000 mg | Freq: Once | INTRAMUSCULAR | Status: AC
  Administered 2016-11-18: 4 mg via INTRAVENOUS
  Filled 2016-11-18: qty 2

## 2016-11-18 NOTE — Discharge Instructions (Signed)
Augmentin as prescribed.  Hydrocodone as prescribed as needed for pain.  Metamucil: 1 heaping teaspoon in a glass of water 2-3 times daily.  Colace (equate stool softener): 100 mg twice daily while taking pain medication.  Return to the emergency department if you develop high fevers, worsening pain, rectal bleeding, or other new and concerning symptoms.

## 2016-11-18 NOTE — ED Triage Notes (Signed)
Pt presents with pressure in her rectum, pt attempts to pass stool but unsuccessfully; pt states she feels as if there is something there blocking passage of stool; pt has hx of constipation and takes linzess, also tried enemas

## 2016-11-18 NOTE — ED Provider Notes (Signed)
[facility] Provider Note   CSN: [id number] Arrival date & time: 11/18/16  1519     History   Chief Complaint Chief Complaint  Patient presents with  . Constipation  . Fecal Impaction    HPI Harvest [name] is a 51 y.o. female.  Patient is a 51 year old female with history of hypertension, high cholesterol presenting for evaluation of rectal pain. This has been worsening over the past several days. She feels as though she is constipated and has extreme rectal pain when attempting to defecate. She denies any fevers or chills. She denies any diarrhea. She has no history of hemorrhoids. She has had a colonoscopy in the past, however this revealed no abnormal results.      Past Medical History:  Diagnosis Date  . Anxiety   . Depression   . Hyperlipidemia   . Hypertension   . Subclinical hypothyroidism     Patient Active Problem List   Diagnosis Date Noted  . Elevated TSH 08/12/2016  . Hypertension   . Hyperlipidemia   . Anxiety   . Depression     Past Surgical History:  Procedure Laterality Date  . SHOULDER SURGERY    . TUBAL LIGATION      OB History    No data available       Home Medications    Prior to Admission medications   Medication Sig Start Date End Date Taking? Authorizing Provider  linaclotide (LINZESS) 145 MCG CAPS capsule Take 145 mcg by mouth daily before breakfast.   Yes [provider]  losartan (COZAAR) 50 MG tablet Take 1 tablet (50 mg total) by mouth daily. Patient not taking: Reported on 08/09/2016 12/27/12   [name], Warren T, MD  rosuvastatin (CRESTOR) 20 MG tablet Take 1 tablet (20 mg total) by mouth daily. Patient not taking: Reported on 08/09/2016 11/05/13   [name], Warren T, MD  traZODone (DESYREL) 50 MG tablet TAKE 1/2 TO 1 TABLET BY MOUTH EVERY NIGHT AT BEDTIME AS NEEDED FOR SLEEP Patient not taking: Reported on 08/09/2016 10/31/13   [name], Warren T, MD    Family History History reviewed. No pertinent family  history.  Social History Social History  Substance Use Topics  . Smoking status: Never Smoker  . Smokeless tobacco: Never Used  . Alcohol use No     Allergies   Patient has no known allergies.   Review of Systems Review of Systems  All other systems reviewed and are negative.    Physical Exam Updated Vital Signs BP 139/74 (BP Location: Left Arm)   Pulse 67   Temp 98.5 F (36.9 C) (Oral)   Resp 18   LMP 11/16/2010   SpO2 98%   Physical Exam  Constitutional: She is oriented to person, place, and time. She appears well-developed and well-nourished. No distress.  HENT:  Head: Normocephalic and atraumatic.  Neck: Normal range of motion. Neck supple.  Cardiovascular: Normal rate and regular rhythm.  Exam reveals no gallop and no friction rub.   No murmur heard. Pulmonary/Chest: Effort normal and breath sounds normal. No respiratory distress. She has no wheezes.  Abdominal: Soft. Bowel sounds are normal. She exhibits no distension. There is no tenderness.  Genitourinary:  Genitourinary Comments: The rectum appears normal. There are no obvious fissures or hemorrhoids. Digital rectal exam reveals exquisite tenderness, however no stool is palpable.  Musculoskeletal: Normal range of motion.  Neurological: She is alert and oriented to person, place, and time.  Skin: Skin is warm and dry. She  is not diaphoretic.  Nursing note and vitals reviewed.    ED Treatments / Results  Labs (all labs ordered are listed, but only abnormal results are displayed) Labs Reviewed  BASIC METABOLIC PANEL  CBC WITH DIFFERENTIAL/PLATELET    EKG  EKG Interpretation None       Radiology No results found.  Procedures Procedures (including critical care time)  Medications Ordered in ED Medications - No data to display   Initial Impression / Assessment and Plan / ED Course  I have reviewed the triage vital signs and the nursing notes.  Pertinent labs & imaging results that were  available during my care of the patient were reviewed by me and considered in my medical decision making (see chart for details).  Patient presents with complaints of severe rectal pain. She believes she was constipated, however the etiology of her symptoms appears to be diverticulitis with small diverticular abscess. She is afebrile with no white count, and appears nontoxic.  I've discussed the clinical situation with Dr. Dwain Sarna from general surgery. He is recommending antibiotics and follow-up with the patient's PCP. As the abscesses 2 cm he does not feel as of this will require drainage. If her symptoms or condition worsens, she understands to return to the ER for reevaluation.  She will be given Augmentin, pain medication, and advised to follow a high-fiber diet with fiber supplementation, stool softeners.  Final Clinical Impressions(s) / ED Diagnoses   Final diagnoses:  None    New Prescriptions New Prescriptions   No medications on file     Geoffery Lyons, MD 11/18/16 2307

## 2016-11-18 NOTE — ED Notes (Signed)
Patient transported to CT 

## 2016-11-25 ENCOUNTER — Ambulatory Visit: Payer: Medicaid Other | Admitting: Family Medicine

## 2016-12-15 ENCOUNTER — Encounter: Payer: Self-pay | Admitting: Family Medicine

## 2017-01-12 ENCOUNTER — Encounter: Payer: Self-pay | Admitting: Family Medicine

## 2017-02-04 ENCOUNTER — Encounter (HOSPITAL_COMMUNITY): Payer: Self-pay | Admitting: Emergency Medicine

## 2017-02-04 ENCOUNTER — Emergency Department (HOSPITAL_COMMUNITY): Payer: Medicaid Other

## 2017-02-04 ENCOUNTER — Emergency Department (HOSPITAL_COMMUNITY)
Admission: EM | Admit: 2017-02-04 | Discharge: 2017-02-04 | Disposition: A | Discharge status: Home / Self Care | Payer: Medicaid Other | Attending: Emergency Medicine | Admitting: Emergency Medicine

## 2017-02-04 DIAGNOSIS — S62627A Displaced fracture of medial phalanx of left little finger, initial encounter for closed fracture: Secondary | ICD-10-CM | POA: Diagnosis not present

## 2017-02-04 DIAGNOSIS — F419 Anxiety disorder, unspecified: Secondary | ICD-10-CM | POA: Diagnosis not present

## 2017-02-04 DIAGNOSIS — W230XXA Caught, crushed, jammed, or pinched between moving objects, initial encounter: Secondary | ICD-10-CM | POA: Diagnosis not present

## 2017-02-04 DIAGNOSIS — Y9389 Activity, other specified: Secondary | ICD-10-CM | POA: Insufficient documentation

## 2017-02-04 DIAGNOSIS — Y929 Unspecified place or not applicable: Secondary | ICD-10-CM | POA: Diagnosis not present

## 2017-02-04 DIAGNOSIS — Y998 Other external cause status: Secondary | ICD-10-CM | POA: Diagnosis not present

## 2017-02-04 DIAGNOSIS — F329 Major depressive disorder, single episode, unspecified: Secondary | ICD-10-CM | POA: Insufficient documentation

## 2017-02-04 DIAGNOSIS — Z79899 Other long term (current) drug therapy: Secondary | ICD-10-CM | POA: Diagnosis not present

## 2017-02-04 DIAGNOSIS — I10 Essential (primary) hypertension: Secondary | ICD-10-CM | POA: Diagnosis not present

## 2017-02-04 DIAGNOSIS — S6992XA Unspecified injury of left wrist, hand and finger(s), initial encounter: Secondary | ICD-10-CM | POA: Diagnosis present

## 2017-02-04 MED ORDER — MELOXICAM 15 MG PO TABS: 15.0000 mg | ORAL_TABLET | Freq: Every day | ORAL | 0 refills | Status: DC

## 2017-02-04 MED ORDER — TRAMADOL HCL 50 MG PO TABS: 50.0000 mg | ORAL_TABLET | Freq: Four times a day (QID) | ORAL | 0 refills | Status: DC | PRN

## 2017-02-04 NOTE — ED Provider Notes (Signed)
[facility]-EMERGENCY DEPT Provider Note   CSN: [id number] Arrival date & time: 02/04/17  1003     History   Chief Complaint Chief Complaint  Patient presents with  . Finger Injury    HPI [name] is a 51 y.o. female who presents emergency Department with chief complaint of finger pain. 3 weeks ago she slammed her left pinky in the car door. She states that overtime he has improved but she still hasn't daily throbbing and swelling. She has tried ice and Tylenol with some relief of her pain. 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 She denies any numbness or tingling. She states that by the end of the day the finger is throbbing with pain.  HPI  Past Medical History:  Diagnosis Date  . Anxiety   . Depression   . Hyperlipidemia   . Hypertension   . Subclinical hypothyroidism     Patient Active Problem List   Diagnosis Date Noted  . Elevated TSH 08/12/2016  . Hypertension   . Hyperlipidemia   . Anxiety   . Depression     Past Surgical History:  Procedure Laterality Date  . SHOULDER SURGERY    . TUBAL LIGATION      OB History    No data available       Home Medications    Prior to Admission medications   Medication Sig Start Date End Date Taking? Authorizing Provider  amoxicillin-clavulanate (AUGMENTIN) 500-125 MG tablet Take 1 tablet (500 mg total) by mouth 3 (three) times daily. 11/18/16   Geoffery Lyons, MD  HYDROcodone-acetaminophen (NORCO) 5-325 MG tablet Take 1-2 tablets by mouth every 6 (six) hours as needed. 11/18/16   Geoffery Lyons, MD  linaclotide (LINZESS) 145 MCG CAPS capsule Take 145 mcg by mouth daily before breakfast.    [provider]  losartan (COZAAR) 50 MG tablet Take 1 tablet (50 mg total) by mouth daily. Patient not taking: Reported on 08/09/2016 12/27/12   Donita Brooks, MD  meloxicam (MOBIC) 15 MG tablet Take 1 tablet (15 mg total) by mouth daily. 02/04/17   Arthor Captain,  PA-C  rosuvastatin (CRESTOR) 20 MG tablet Take 1 tablet (20 mg total) by mouth daily. Patient not taking: Reported on 08/09/2016 11/05/13   Donita Brooks, MD  traMADol (ULTRAM) 50 MG tablet Take 1 tablet (50 mg total) by mouth every 6 (six) hours as needed. 02/04/17   Arthor Captain, PA-C  traZODone (DESYREL) 50 MG tablet TAKE 1/2 TO 1 TABLET BY MOUTH EVERY NIGHT AT BEDTIME AS NEEDED FOR SLEEP Patient not taking: Reported on 08/09/2016 10/31/13   Donita Brooks, MD    Family History No family history on file.  Social History Social History  Substance Use Topics  . Smoking status: Never Smoker  . Smokeless tobacco: Never Used  . Alcohol use No     Allergies   Patient has no known allergies.   Review of Systems Review of Systems  Ten systems reviewed and are negative for acute change, except as noted in the HPI.   Physical Exam Updated Vital Signs BP (!) 159/102 (BP Location: Left Arm)   Pulse 63   Temp 97.8 F (36.6 C)   Resp 17   Ht  (1.6 m)   Wt 56.7 kg (125 lb)   LMP 11/16/2010   SpO2 100%   BMI 22.14 kg/m   Physical Exam  Constitutional: She is oriented to person, place,  and time. She appears well-developed and well-nourished. No distress.  HENT:  Head: Normocephalic and atraumatic.  Eyes: Conjunctivae are normal. No scleral icterus.  Neck: Normal range of motion.  Cardiovascular: Normal rate, regular rhythm and normal heart sounds.  Exam reveals no gallop and no friction rub.   No murmur heard. Pulmonary/Chest: Effort normal and breath sounds normal. No respiratory distress.  Abdominal: Soft. Bowel sounds are normal. She exhibits no distension and no mass. There is no tenderness. There is no guarding.  Musculoskeletal:  Left fifth digit with swelling, able to move the MCP PIP and DIP. Swelling present normal capillary refill  Neurological: She is alert and oriented to person, place, and time.  Skin: Skin is warm and dry. She is not diaphoretic.    Psychiatric: Her behavior is normal.  Nursing note and vitals reviewed.    ED Treatments / Results  Labs (all labs ordered are listed, but only abnormal results are displayed) Labs Reviewed - No data to display  EKG  EKG Interpretation None       Radiology Dg Finger Little Left  Result Date: 02/04/2017 CLINICAL DATA:  Continued left little finger pain following car door injury 3 weeks ago. Initial encounter. EXAM: LEFT LITTLE FINGER 2+V COMPARISON:  None. FINDINGS: A fracture of the mid aspect of the middle phalanx is noted with apex anterior angulation. No dislocation identified. Soft tissue swelling is noted. IMPRESSION: Angulated fracture of the middle phalanx. Electronically Signed   By: Harmon Pier M.D.   On: 02/04/2017 11:13    Procedures Procedures (including critical care time)  Medications Ordered in ED Medications - No data to display   Initial Impression / Assessment and Plan / ED Course  I have reviewed the triage vital signs and the nursing notes.  Pertinent labs & imaging results that were available during my care of the patient were reviewed by me and considered in my medical decision making (see chart for details).     Patient with angulated midshaft middle phalanx fracture in the left pinky. There is callus formation present. I do not feel that I can reproduce this fracture as it has been 3 weeks and see original incident. Patient was placed in splint. She has orthopedic follow-up will be given referral to hand surgeon if necessary. Patient will be given Mobic and tramadol at discharge. She appears safe for discharge at this time  Final Clinical Impressions(s) / ED Diagnoses   Final diagnoses:  Closed displaced fracture of middle phalanx of left little finger, initial encounter    New Prescriptions New Prescriptions   MELOXICAM (MOBIC) 15 MG TABLET    Take 1 tablet (15 mg total) by mouth daily.   TRAMADOL (ULTRAM) 50 MG TABLET    Take 1 tablet (50 mg  total) by mouth every 6 (six) hours as needed.     Arthor Captain, PA-C 02/04/17 1244    Charlynne Pander, MD 02/04/17 [phone]

## 2017-02-04 NOTE — ED Notes (Signed)
Declined W/C at D/C and was escorted to lobby by RN. 

## 2017-02-04 NOTE — ED Triage Notes (Signed)
Pt. Stated, I injured my left little finger 3 weeks ago, slammed in car door.  Its no better and seems to get bigger at the end of the day.

## 2017-02-04 NOTE — Discharge Instructions (Signed)
Contact a health care provider if: °Your pain or swelling gets worse even with treatment. °You have trouble moving your finger. °Get help right away if: °Your finger becomes numb or blue. °

## 2017-04-12 ENCOUNTER — Other Ambulatory Visit: Payer: Self-pay | Admitting: Family Medicine

## 2017-04-12 ENCOUNTER — Other Ambulatory Visit: Payer: Self-pay

## 2017-04-12 DIAGNOSIS — Z79899 Other long term (current) drug therapy: Secondary | ICD-10-CM

## 2017-04-12 DIAGNOSIS — Z0000 Encounter for general adult medical examination without abnormal findings: Secondary | ICD-10-CM

## 2017-04-12 DIAGNOSIS — E785 Hyperlipidemia, unspecified: Secondary | ICD-10-CM

## 2017-04-12 DIAGNOSIS — F329 Major depressive disorder, single episode, unspecified: Secondary | ICD-10-CM

## 2017-04-12 DIAGNOSIS — I10 Essential (primary) hypertension: Secondary | ICD-10-CM

## 2017-04-12 DIAGNOSIS — F419 Anxiety disorder, unspecified: Secondary | ICD-10-CM

## 2017-04-13 ENCOUNTER — Encounter: Payer: Self-pay | Admitting: Family Medicine

## 2017-04-14 ENCOUNTER — Encounter: Payer: Self-pay | Admitting: Family Medicine

## 2017-06-19 ENCOUNTER — Other Ambulatory Visit: Payer: Self-pay

## 2017-06-19 ENCOUNTER — Emergency Department (HOSPITAL_COMMUNITY): Payer: Medicaid Other

## 2017-06-19 ENCOUNTER — Emergency Department (HOSPITAL_COMMUNITY)
Admission: EM | Admit: 2017-06-19 | Discharge: 2017-06-19 | Disposition: A | Discharge status: Home / Self Care | Payer: Medicaid Other | Attending: Emergency Medicine | Admitting: Emergency Medicine

## 2017-06-19 ENCOUNTER — Encounter (HOSPITAL_COMMUNITY): Payer: Self-pay

## 2017-06-19 DIAGNOSIS — Y9301 Activity, walking, marching and hiking: Secondary | ICD-10-CM | POA: Diagnosis not present

## 2017-06-19 DIAGNOSIS — E039 Hypothyroidism, unspecified: Secondary | ICD-10-CM | POA: Diagnosis not present

## 2017-06-19 DIAGNOSIS — S8991XA Unspecified injury of right lower leg, initial encounter: Secondary | ICD-10-CM | POA: Diagnosis present

## 2017-06-19 DIAGNOSIS — X509XXA Other and unspecified overexertion or strenuous movements or postures, initial encounter: Secondary | ICD-10-CM | POA: Diagnosis not present

## 2017-06-19 DIAGNOSIS — Z79899 Other long term (current) drug therapy: Secondary | ICD-10-CM | POA: Diagnosis not present

## 2017-06-19 DIAGNOSIS — Y92480 Sidewalk as the place of occurrence of the external cause: Secondary | ICD-10-CM | POA: Diagnosis not present

## 2017-06-19 DIAGNOSIS — I10 Essential (primary) hypertension: Secondary | ICD-10-CM | POA: Diagnosis not present

## 2017-06-19 DIAGNOSIS — M25461 Effusion, right knee: Secondary | ICD-10-CM | POA: Insufficient documentation

## 2017-06-19 DIAGNOSIS — Y998 Other external cause status: Secondary | ICD-10-CM | POA: Diagnosis not present

## 2017-06-19 DIAGNOSIS — S83200A Bucket-handle tear of unspecified meniscus, current injury, right knee, initial encounter: Secondary | ICD-10-CM | POA: Insufficient documentation

## 2017-06-19 MED ORDER — HYDROCODONE-ACETAMINOPHEN 5-325 MG PO TABS
1.0000 | ORAL_TABLET | Freq: Once | ORAL | Status: AC
  Administered 2017-06-19: 1 via ORAL
  Filled 2017-06-19: qty 1

## 2017-06-19 MED ORDER — IBUPROFEN 800 MG PO TABS: 800.0000 mg | ORAL_TABLET | Freq: Three times a day (TID) | ORAL | 0 refills | Status: DC

## 2017-06-19 MED ORDER — HYDROCODONE-ACETAMINOPHEN 5-325 MG PO TABS: 1.0000 | ORAL_TABLET | Freq: Four times a day (QID) | ORAL | 0 refills | Status: DC | PRN

## 2017-06-19 NOTE — ED Provider Notes (Signed)
[facility]   CSN: [id number] Arrival date & time: 06/19/17  1346     History   Chief Complaint No chief complaint on file.   HPI Harvest [name] is a 52 y.o. female presenting for evaluation of right knee pain.  Patient states she was stepping off the curb when her right knee gave out, she fell, inverting her lower leg.  She reports acute onset knee pain.  She has a history of knee pain, sees Dr. Magnus IvanBlackman from orthopedics.  She has had multiple cortisone shots in the knee.  She reports continued pain and swelling today, despite ibuprofen and ice.  She states she is unable to ambulate due to pain.  She is unable to extend her leg due to pain.  She denies numbness or tingling.  She denies injury elsewhere.  Pain is constant and sharp, worse anteriorly and medially.  She is not on blood thinners.  Nothing makes the pain better.  It does not radiate.  HPI  Past Medical History:  Diagnosis Date  . Anxiety   . Depression   . Hyperlipidemia   . Hypertension   . Subclinical hypothyroidism     Patient Active Problem List   Diagnosis Date Noted  . Elevated TSH 08/12/2016  . Hypertension   . Hyperlipidemia   . Anxiety   . Depression     Past Surgical History:  Procedure Laterality Date  . SHOULDER SURGERY    . TUBAL LIGATION      OB History    No data available       Home Medications    Prior to Admission medications   Medication Sig Start Date End Date Taking? Authorizing Provider  amoxicillin-clavulanate (AUGMENTIN) 500-125 MG tablet Take 1 tablet (500 mg total) by mouth 3 (three) times daily. 11/18/16   Geoffery Lyonselo, Douglas, MD  HYDROcodone-acetaminophen (NORCO/VICODIN) 5-325 MG tablet Take 1 tablet by mouth every 6 (six) hours as needed for severe pain. 06/19/17   Nik Gorrell, PA-C  ibuprofen (ADVIL,MOTRIN) 800 MG tablet Take 1 tablet (800 mg total) by mouth 3 (three) times daily with meals. 06/19/17   Jenai Scaletta,  Zachariah Pavek, PA-C  linaclotide (LINZESS) 145 MCG CAPS capsule Take 145 mcg by mouth daily before breakfast.    [provider]  losartan (COZAAR) 50 MG tablet Take 1 tablet (50 mg total) by mouth daily. Patient not taking: Reported on 08/09/2016 12/27/12   Donita BrooksPickard, Warren T, MD  meloxicam (MOBIC) 15 MG tablet Take 1 tablet (15 mg total) by mouth daily. 02/04/17   Arthor CaptainHarris, Abigail, PA-C  rosuvastatin (CRESTOR) 20 MG tablet Take 1 tablet (20 mg total) by mouth daily. Patient not taking: Reported on 08/09/2016 11/05/13   Donita BrooksPickard, Warren T, MD  traMADol (ULTRAM) 50 MG tablet Take 1 tablet (50 mg total) by mouth every 6 (six) hours as needed. 02/04/17   Arthor CaptainHarris, Abigail, PA-C  traZODone (DESYREL) 50 MG tablet TAKE 1/2 TO 1 TABLET BY MOUTH EVERY NIGHT AT BEDTIME AS NEEDED FOR SLEEP Patient not taking: Reported on 08/09/2016 10/31/13   Donita BrooksPickard, Warren T, MD    Family History No family history on file.  Social History Social History   Tobacco Use  . Smoking status: Never Smoker  . Smokeless tobacco: Never Used  Substance Use Topics  . Alcohol use: No  . Drug use: No     Allergies   Patient has no known allergies.   Review of Systems Review of Systems  Musculoskeletal: Positive  for arthralgias and joint swelling.  Neurological: Negative for numbness.  Hematological: Does not bruise/bleed easily.     Physical Exam Updated Vital Signs BP (!) 156/106 (BP Location: Left Arm)   Pulse 62   Temp 98.3 F (36.8 C) (Oral)   Resp 16   LMP 11/16/2010   SpO2 100%   Physical Exam  Constitutional: She is oriented to person, place, and time. She appears well-developed and well-nourished. No distress.  HENT:  Head: Normocephalic and atraumatic.  Eyes: EOM are normal.  Neck: Normal range of motion.  Pulmonary/Chest: Effort normal.  Abdominal: She exhibits no distension.  Musculoskeletal: She exhibits edema and tenderness.  Swelling of the right knee.  Tenderness palpation of anterior knee,  worse medially.  No obvious contusions or injury.  No pain with palpation posteriorly.  Decreased passive range of motion, patient unable to do active range of motion due to pain.  Sensation of lower extremities bilaterally.  Pedal pulses equal bilaterally.  Color and warmth equal bilaterally.  No tenderness to palpation of the calf.   Neurological: She is alert and oriented to person, place, and time.  Skin: Skin is warm. No rash noted.  Psychiatric: She has a normal mood and affect.  Nursing note and vitals reviewed.    ED Treatments / Results  Labs (all labs ordered are listed, but only abnormal results are displayed) Labs Reviewed - No data to display  EKG  EKG Interpretation None       Radiology Ct Knee Right Wo Contrast  Result Date: 06/19/2017 CLINICAL DATA:  Stepped off a curb and hurt the right knee.  Pain. EXAM: CT OF THE RIGHT KNEE WITHOUT CONTRAST TECHNIQUE: Multidetector CT imaging of the RIGHT knee was performed according to the standard protocol. Multiplanar CT image reconstructions were also generated. COMPARISON:  None. FINDINGS: Bones/Joint/Cartilage No fracture or dislocation. Normal alignment. Small joint effusion. No aggressive osseous lesion. Ligaments/menisci Ligaments are suboptimally evaluated by CT. Large bucket-handle tear of the medial meniscus flipped towards the intercondylar notch. ACL is severely attenuated concerning for a complete tear. Muscles and Tendons Soft tissue No fluid collection or hematoma.  No soft tissue mass. IMPRESSION: 1.  No acute osseous injury of the right knee. 2. Large bucket-handle tear of the medial meniscus flipped towards the intercondylar notch. ACL is severely attenuated concerning for a complete tear. Recommend orthopedic surgery consultation. Electronically Signed   By: Elige Ko   On: 06/19/2017 17:15   Dg Knee Complete 4 Views Right  Result Date: 06/19/2017 CLINICAL DATA:  Knee pain and anterior soft tissue swelling after  stepping off a curb wrong last night EXAM: RIGHT KNEE - COMPLETE 4+ VIEW COMPARISON:  03/21/2010 FINDINGS: Low normal osseous mineralization. Medial compartment joint space narrowing. No acute fracture, dislocation, or bone destruction. Suspect small knee joint effusion. Minimal anterior soft tissue swelling infrapatellar. IMPRESSION: No acute osseous abnormalities. Question small knee joint effusion. Electronically Signed   By: Ulyses Southward M.D.   On: 06/19/2017 15:25    Procedures Procedures (including critical care time)  Medications Ordered in ED Medications  HYDROcodone-acetaminophen (NORCO/VICODIN) 5-325 MG per tablet 1 tablet (not administered)  HYDROcodone-acetaminophen (NORCO/VICODIN) 5-325 MG per tablet 1 tablet (1 tablet Oral Given 06/19/17 1537)     Initial Impression / Assessment and Plan / ED Course  I have reviewed the triage vital signs and the nursing notes.  Pertinent labs & imaging results that were available during my care of the patient were reviewed by me and  considered in my medical decision making (see chart for details).     Patient presenting for evaluation of right knee pain and swelling.  She is neurovascularly intact, however has pain with palpation and passive range of motion.  Will obtain x-ray for further evaluation.  Norco given for pain.  Reviewed and evaluated by me.  No obvious fracture.  Mild joint effusion.  Patient's pain minimally improved with Norco.  Patient remains unable to ambulate.  Will obtain CT scan to rule out occult fracture, tibial plateau fracture, or other injury.  CT shows large bucket handle tear of the meniscus and possible tear of the ACL.  No sign of fracture.  Will place patient in knee immobilizer and give crutches.  Scheduled NSAIDs with Norco for pain.  Patient to follow-up with orthopedics. PMP checked, pt with 2 narco rx in the past year for acute injuries.  Discussed findings and plan with patient.  At this time, patient appears  safe for discharge.  Return precautions given.  Patient states she understands and agrees to plan.  Final Clinical Impressions(s) / ED Diagnoses   Final diagnoses:  Bucket handle tear of meniscus of right knee, unspecified meniscus, unspecified whether old or current tear, initial encounter  Effusion of right knee    ED Discharge Orders        Ordered    ibuprofen (ADVIL,MOTRIN) 800 MG tablet  3 times daily with meals     06/19/17 1733    HYDROcodone-acetaminophen (NORCO/VICODIN) 5-325 MG tablet  Every 6 hours PRN     06/19/17 1733       Alveria Apley, PA-C 06/19/17 1737    Alveria Apley, PA-C 06/19/17 1738    Azalia Bilis, MD 06/20/17 1635

## 2017-06-19 NOTE — Progress Notes (Signed)
Orthopedic Tech Progress Note Patient Details:  [name] [place] Feb 24, 1966 [id number]  Ortho Devices Type of Ortho Device: Crutches, Knee Immobilizer Ortho Device/Splint Location: Right knee/leg Ortho Device/Splint Interventions: Application, Adjustment   Post Interventions Patient Tolerated: Well, Ambulated well Instructions Provided: Adjustment of device, Care of device, Poper ambulation with device   Alvina ChouWilliams, Martinez Boxx C 06/19/2017, 6:11 PM

## 2017-06-19 NOTE — ED Notes (Signed)
Patient transported to X-ray 

## 2017-06-19 NOTE — ED Notes (Signed)
Ortho at bedside.

## 2017-06-19 NOTE — Discharge Instructions (Signed)
Take ibuprofen 3 times a day with meals.  Do not take other anti-inflammatories at the same time open (Advil, Motrin, naproxen, Aleve).  Take Norco as needed for severe pain. Use the knee immobilizer at all times except when showering. Use crutches as needed. Apply ice to your knee whenever able, 20 minutes at a time. Follow-up with your orthopedic doctor for further evaluation and management of your knee. Return to the emergency room if you develop numbness, fevers, worsening pain, or any new or concerning symptoms.

## 2017-06-19 NOTE — ED Triage Notes (Signed)
Patient complains of right knee pain and swelling after stepping off curb last night. Pain with any ambulation

## 2017-06-21 ENCOUNTER — Encounter (INDEPENDENT_AMBULATORY_CARE_PROVIDER_SITE_OTHER): Payer: Self-pay | Admitting: Physician Assistant

## 2017-06-21 ENCOUNTER — Ambulatory Visit (INDEPENDENT_AMBULATORY_CARE_PROVIDER_SITE_OTHER): Payer: Medicaid Other | Admitting: Physician Assistant

## 2017-06-21 DIAGNOSIS — M25561 Pain in right knee: Secondary | ICD-10-CM

## 2017-06-21 MED ORDER — HYDROCODONE-ACETAMINOPHEN 5-325 MG PO TABS: 1.0000 | ORAL_TABLET | Freq: Four times a day (QID) | ORAL | 0 refills | Status: DC | PRN

## 2017-06-21 NOTE — Progress Notes (Signed)
Office Visit Note   Patient: [name]           Date of Birth: June 20, 1965           MRN: [id number] Visit Date: 06/21/2017              Requested by: Donita Brooks, MD [address] [place], [place] [address] PCP: Donita Brooks, MD   Assessment & Plan: Visit Diagnoses:  1. Acute pain of right knee     Plan: We will have her go for an MRI of her right knee to better evaluate her ACL and the meniscus.  The CT definitely is a limited study for these soft tissue structures.  And for surgical planning the MRI is definitely superior.  Have her follow-up with Dr. August Saucer after the MRI to go over the results and discuss further treatment.  Given some hydrocodone for pain.  Should continue use knee immobilizer and crutches.  Follow-Up Instructions: Return for after MRI Dr. August Saucer.   Orders:  No orders of the defined types were placed in this encounter.  Meds ordered this encounter  Medications  . HYDROcodone-acetaminophen (NORCO/VICODIN) 5-325 MG tablet    Sig: Take 1 tablet by mouth every 6 (six) hours as needed for severe pain.    Dispense:  25 tablet    Refill:  0      Procedures: No procedures performed   Clinical Data: No additional findings.   Subjective: Chief Complaint  Patient presents with  . Right Knee - Pain    HPI [name] is a 52 year old female who is been seen by Dr. Magnus Ivan in the past comes in today due to acute injury to her right knee.  She reports on Sunday night she was going out to get a charger out of her car slipped off the curb and turned her right knee.  She is seen in the ER radiographs were obtained and a CT scan was obtained.  There is questionable of acute ACL tear and a bucket-handle meniscal tear on CT.  She is placed in a knee immobilizer and given crutches was told to be touchdown weightbearing.  She comes in today for evaluation of the knee.  She was last seen here for her right knee in 2014 and at that time had a  hemarthrosis of her right knee due to an acute injury and it was recommended that she have an MRI which she never obtained.  She states both knees have giving way on her for some time now.  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  Review of Systems See HPI otherwise negative   Objective: Vital Signs: LMP 11/16/2010   Physical Exam  Constitutional: She is oriented to person, place, and time. She appears well-developed and well-nourished. No distress.  Cardiovascular: Intact distal pulses.  Pulmonary/Chest: Effort normal.  Neurological: She is alert and oriented to person, place, and time.  Skin: She is not diaphoretic.  Psychiatric: She has a normal mood and affect.    Ortho Exam Right knee tenderness along the medial joint line.  No gross instability with valgus varus stressing.  Anterior drawer is positive.  She lacks approximately 15-20 degrees of full extension both actively and passively.  Passively I can bring her knee to 90 degrees of flexion she is unable to tolerate any forced flexion.  Specialty Comments:  No specialty comments available.  Imaging: No results found.   PMFS History:  Patient Active Problem List   Diagnosis Date Noted  . Elevated TSH 08/12/2016  . Hypertension   . Hyperlipidemia   . Anxiety   . Depression    Past Medical History:  Diagnosis Date  . Anxiety   . Depression   . Hyperlipidemia   . Hypertension   . Subclinical hypothyroidism     No family history on file.  Past Surgical History:  Procedure Laterality Date  . SHOULDER SURGERY    . TUBAL LIGATION     Social History   Occupational History  . Not on file  Tobacco Use  . Smoking status: Never Smoker  . Smokeless tobacco: Never Used  Substance and Sexual Activity  . Alcohol use: No  . Drug use: No  . Sexual activity: Not on file

## 2017-06-27 ENCOUNTER — Ambulatory Visit (HOSPITAL_COMMUNITY): Admission: RE | Admit: 2017-06-27 | Payer: Medicaid Other | Source: Ambulatory Visit

## 2017-06-29 ENCOUNTER — Other Ambulatory Visit (INDEPENDENT_AMBULATORY_CARE_PROVIDER_SITE_OTHER): Payer: Self-pay | Admitting: Physician Assistant

## 2017-06-29 ENCOUNTER — Telehealth (INDEPENDENT_AMBULATORY_CARE_PROVIDER_SITE_OTHER): Payer: Self-pay

## 2017-06-29 MED ORDER — HYDROCODONE-ACETAMINOPHEN 5-325 MG PO TABS: 1.0000 | ORAL_TABLET | Freq: Four times a day (QID) | ORAL | 0 refills | Status: DC | PRN

## 2017-06-29 NOTE — Telephone Encounter (Signed)
Please advise Has appt with August Saucerean 07/12/17

## 2017-06-29 NOTE — Telephone Encounter (Signed)
Done

## 2017-06-29 NOTE — Telephone Encounter (Signed)
Patient would like Rx refill on Hydrocodone and Ibuprofen.  Cb# is  [id number].  Please advise.  Thank you

## 2017-06-30 ENCOUNTER — Ambulatory Visit (HOSPITAL_COMMUNITY): Payer: Medicaid Other

## 2017-06-30 ENCOUNTER — Other Ambulatory Visit (INDEPENDENT_AMBULATORY_CARE_PROVIDER_SITE_OTHER): Payer: Self-pay | Admitting: Orthopaedic Surgery

## 2017-06-30 MED ORDER — HYDROCODONE-ACETAMINOPHEN 5-325 MG PO TABS: 1.0000 | ORAL_TABLET | Freq: Four times a day (QID) | ORAL | 0 refills | Status: DC | PRN

## 2017-06-30 NOTE — Telephone Encounter (Signed)
I sent it in 

## 2017-06-30 NOTE — Telephone Encounter (Signed)
Attempted to call pharmacy to check if Rx went through to call patient, was kept on hold by auto system twice.

## 2017-06-30 NOTE — Telephone Encounter (Signed)
Can you do me a favor and try to send the hydrocodone with your phone, [name] didn't sign the Rx for Hydrocodone here at the office

## 2017-07-03 ENCOUNTER — Ambulatory Visit (HOSPITAL_COMMUNITY): Admission: RE | Admit: 2017-07-03 | Payer: Medicaid Other | Source: Ambulatory Visit

## 2017-07-12 ENCOUNTER — Ambulatory Visit (INDEPENDENT_AMBULATORY_CARE_PROVIDER_SITE_OTHER): Payer: Medicaid Other | Admitting: Orthopedic Surgery

## 2018-01-12 ENCOUNTER — Encounter: Payer: Self-pay | Admitting: Family Medicine

## 2018-01-12 ENCOUNTER — Ambulatory Visit: Payer: Medicaid Other | Admitting: Family Medicine

## 2018-01-16 ENCOUNTER — Encounter: Payer: Self-pay | Admitting: Family Medicine

## 2018-10-07 ENCOUNTER — Telehealth: Payer: Self-pay | Admitting: Emergency Medicine

## 2018-10-07 ENCOUNTER — Other Ambulatory Visit: Payer: Self-pay

## 2018-10-07 ENCOUNTER — Encounter (HOSPITAL_COMMUNITY): Payer: Self-pay | Admitting: Emergency Medicine

## 2018-10-07 ENCOUNTER — Ambulatory Visit (HOSPITAL_COMMUNITY)
Admission: EM | Admit: 2018-10-07 | Discharge: 2018-10-07 | Disposition: A | Discharge status: Home / Self Care | Payer: Medicaid Other | Attending: Family Medicine | Admitting: Family Medicine

## 2018-10-07 DIAGNOSIS — J040 Acute laryngitis: Secondary | ICD-10-CM

## 2018-10-07 DIAGNOSIS — R112 Nausea with vomiting, unspecified: Secondary | ICD-10-CM

## 2018-10-07 DIAGNOSIS — J069 Acute upper respiratory infection, unspecified: Secondary | ICD-10-CM

## 2018-10-07 MED ORDER — ONDANSETRON 4 MG PO TBDP: 4.0000 mg | ORAL_TABLET | Freq: Three times a day (TID) | ORAL | 0 refills | Status: DC | PRN

## 2018-10-07 MED ORDER — AMOXICILLIN-POT CLAVULANATE 875-125 MG PO TABS: 1.0000 | ORAL_TABLET | Freq: Two times a day (BID) | ORAL | 0 refills | Status: AC

## 2018-10-07 NOTE — Discharge Instructions (Signed)
Push fluids to ensure adequate hydration and keep secretions thin.  Tylenol and/or ibuprofen as needed for pain or fevers.  Zofran every 8 hours as needed for nausea or vomiting.   Complete course of antibiotics.  Because of where you work I do recommend Covid-19 testing. You will be called to set up an appointment for testing at our [facility].  Any worsening of symptoms, shortness of breath , difficulty breathing or otherwise worsening please return or go to the ER.

## 2018-10-07 NOTE — ED Triage Notes (Signed)
Pt here with URI sx with nasal drainage and sore throat; pt sts was vomiting yesterday

## 2018-10-07 NOTE — ED Provider Notes (Signed)
[facility]    CSN: [id number] Arrival date & time: 10/07/18  1021     History   Chief Complaint Chief Complaint  Patient presents with  . URI    HPI [name] is a 53 y.o. female.   [name] presents with complaints of yesterday with vomiting, had eaten at [employer]. A lot of nasal drainage. Hoarse voice. Started 1 week ago. No fevers. Occasional cough. Right ear pain. No sore throat. Diarrhea yesterday. No further. No abdominal pain. No known ill contacts. Illness started prior to going back to work. She is a Producer, television/film/video. Doesn't smoke.no asthma history. Denies any previous similar. No worse, no better. No shortness of breath . Nasal congestion, hoarse voice are dominant complaints. Advil. No other medications for symptoms. Throat lozenges,vicks, hot tea/lemon have been tried, but have not helped. Hx of htn, depression, anxiety.    ROS per HPI, negative if not otherwise mentioned.      Past Medical History:  Diagnosis Date  . Anxiety   . Depression   . Hyperlipidemia   . Hypertension   . Subclinical hypothyroidism     Patient Active Problem List   Diagnosis Date Noted  . Elevated TSH 08/12/2016  . Hypertension   . Hyperlipidemia   . Anxiety   . Depression     Past Surgical History:  Procedure Laterality Date  . SHOULDER SURGERY    . TUBAL LIGATION      OB History   No obstetric history on file.      Home Medications    Prior to Admission medications   Medication Sig Start Date End Date Taking? Authorizing Provider  amoxicillin-clavulanate (AUGMENTIN) 875-125 MG tablet Take 1 tablet by mouth every 12 (twelve) hours for 10 days. 10/07/18 10/17/18  Georgetta Haber, NP  ibuprofen (ADVIL,MOTRIN) 800 MG tablet Take 1 tablet (800 mg total) by mouth 3 (three) times daily with meals. 06/19/17   Caccavale, Sophia, PA-C  linaclotide (LINZESS) 145 MCG CAPS capsule Take 145 mcg by mouth daily before breakfast.    [provider]   ondansetron (ZOFRAN-ODT) 4 MG disintegrating tablet Take 1 tablet (4 mg total) by mouth every 8 (eight) hours as needed for nausea or vomiting. 10/07/18   Georgetta Haber, NP    Family History History reviewed. No pertinent family history.  Social History Social History   Tobacco Use  . Smoking status: Never Smoker  . Smokeless tobacco: Never Used  Substance Use Topics  . Alcohol use: No  . Drug use: No     Allergies   Patient has no known allergies.   Review of Systems Review of Systems   Physical Exam Triage Vital Signs ED Triage Vitals  Enc Vitals Group     BP 10/07/18 1044 (!) 161/91     Pulse Rate 10/07/18 1044 73     Resp 10/07/18 1044 18     Temp 10/07/18 1044 98 F (36.7 C)     Temp Source 10/07/18 1044 Oral     SpO2 10/07/18 1044 98 %     Weight --      Height --      Head Circumference --      Peak Flow --      Pain Score 10/07/18 1045 2     Pain Loc --      Pain Edu? --      Excl. in [place]? --    No data found.  Updated Vital Signs  BP (!) 161/91 (BP Location: Left Arm)   Pulse 73   Temp 98 F (36.7 C) (Oral)   Resp 18   LMP 11/16/2010   SpO2 98%   Visual Acuity Right Eye Distance:   Left Eye Distance:   Bilateral Distance:    Right Eye Near:   Left Eye Near:    Bilateral Near:     Physical Exam Constitutional:      General: She is not in acute distress.    Appearance: She is well-developed.  HENT:     Head: Normocephalic and atraumatic.     Right Ear: External ear normal.     Left Ear: External ear normal.     Mouth/Throat:     Pharynx: Posterior oropharyngeal erythema present. No pharyngeal swelling.     Tonsils: No tonsillar exudate.     Comments: Voice hoarseness noted  Eyes:     Pupils: Pupils are equal, round, and reactive to light.  Cardiovascular:     Rate and Rhythm: Normal rate and regular rhythm.     Heart sounds: Normal heart sounds.  Pulmonary:     Effort: Pulmonary effort is normal. No respiratory distress.      Breath sounds: Normal breath sounds.  Skin:    General: Skin is warm and dry.  Neurological:     Mental Status: She is alert and oriented to person, place, and time.      UC Treatments / Results  Labs (all labs ordered are listed, but only abnormal results are displayed) Labs Reviewed - No data to display  EKG None  Radiology No results found.  Procedures Procedures (including critical care time)  Medications Ordered in UC Medications - No data to display  Initial Impression / Assessment and Plan / UC Course  I have reviewed the triage vital signs and the nursing notes.  Pertinent labs & imaging results that were available during my care of the patient were reviewed by me and considered in my medical decision making (see chart for details).     Non toxic. Benign physical exam.  Afebrile. Works as a Producer, television/film/videohair dresser, do recommend covid testing because of this with order sent. Recommend to self isolate until results and/or symptom improvement. Supportive cares recommended. If symptoms worsen or do not improve in the next week to return to be seen or to follow up with PCP.  Patient verbalized understanding and agreeable to plan.   Final Clinical Impressions(s) / UC Diagnoses   Final diagnoses:  Viral upper respiratory tract infection  Laryngitis  Nausea vomiting and diarrhea     Discharge Instructions     Push fluids to ensure adequate hydration and keep secretions thin.  Tylenol and/or ibuprofen as needed for pain or fevers.  Zofran every 8 hours as needed for nausea or vomiting.   Complete course of antibiotics.  Because of where you work I do recommend Covid-19 testing. You will be called to set up an appointment for testing [facility].  Any worsening of symptoms, shortness of breath , difficulty breathing or otherwise worsening please return or go to the ER.     ED Prescriptions    Medication Sig Dispense Auth. Provider   amoxicillin-clavulanate  (AUGMENTIN) 875-125 MG tablet Take 1 tablet by mouth every 12 (twelve) hours for 10 days. 20 tablet Linus MakoBurky, Breasia Karges B, NP   ondansetron (ZOFRAN-ODT) 4 MG disintegrating tablet Take 1 tablet (4 mg total) by mouth every 8 (eight) hours as needed for nausea  or vomiting. 12 tablet Georgetta Haber, NP     Controlled Substance Prescriptions [place] Controlled Substance Registry consulted? Not Applicable   Georgetta Haber, NP 10/07/18 1545

## 2018-10-07 NOTE — Telephone Encounter (Signed)
Called and left message on VM to make appt for Covid testing.

## 2018-10-07 NOTE — Telephone Encounter (Signed)
-----   Message from Natalie B Burky, NP sent at 10/07/2018 11:03 AM EDT ----- °Regarding: Covid testing needed ° ° °

## 2018-10-08 ENCOUNTER — Telehealth: Payer: Self-pay

## 2018-10-08 ENCOUNTER — Telehealth: Payer: Self-pay | Admitting: Family Medicine

## 2018-10-08 NOTE — Telephone Encounter (Signed)
Called pt at [facility], lvm for pt to return call for Covid testing.   Pt was referred by: Georgetta Haber, NP

## 2018-10-08 NOTE — Telephone Encounter (Deleted)
-----   Message from Georgetta Haber, NP sent at 10/07/2018 11:03 AM EDT ----- Regarding: Covid testing needed

## 2018-10-08 NOTE — Telephone Encounter (Deleted)
Called pt at [facility] to scheduled Covid testing. No answer, lvm for pt to return call for scheduling.   Pt was referred by: Georgetta Haber, NP

## 2018-10-08 NOTE — Telephone Encounter (Addendum)
Patient called, left VM to return call to [phone] between 0700-1900 Monday through Friday to schedule testing.  ----- Message from Georgetta Haber, NP sent at 10/07/2018 11:03 AM EDT ----- Regarding: Covid testing needed

## 2018-10-31 ENCOUNTER — Ambulatory Visit (INDEPENDENT_AMBULATORY_CARE_PROVIDER_SITE_OTHER): Payer: Medicaid Other

## 2018-10-31 ENCOUNTER — Encounter (HOSPITAL_COMMUNITY): Payer: Self-pay | Admitting: Emergency Medicine

## 2018-10-31 ENCOUNTER — Ambulatory Visit (HOSPITAL_COMMUNITY)
Admission: EM | Admit: 2018-10-31 | Discharge: 2018-10-31 | Disposition: A | Discharge status: Home / Self Care | Payer: Medicaid Other | Attending: Family Medicine | Admitting: Family Medicine

## 2018-10-31 DIAGNOSIS — S90222A Contusion of left lesser toe(s) with damage to nail, initial encounter: Secondary | ICD-10-CM

## 2018-10-31 MED ORDER — HYDROCODONE-ACETAMINOPHEN 5-325 MG PO TABS: 1.0000 | ORAL_TABLET | Freq: Four times a day (QID) | ORAL | 0 refills | Status: DC | PRN

## 2018-10-31 NOTE — Discharge Instructions (Addendum)
No fracture is seen on the x-ray

## 2018-10-31 NOTE — ED Provider Notes (Signed)
[facility]    CSN: [id number] Arrival date & time: 10/31/18  1704     History   Chief Complaint Chief Complaint  Patient presents with  . Foot Pain    HPI [name] is a 53 y.o. female.   Established [facility] patient  Pt states after fathers day she kicked a speaker over and it landed on her L foot. Pain and swelling to L foot, bruising noted on big toe.  This happened 2 days ago.   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  She had her nails done yesterday but when they went to put on nail polish she noticed that one half of the nail was black and blue and the pedicure was very painful.     Past Medical History:  Diagnosis Date  . Anxiety   . Depression   . Hyperlipidemia   . Hypertension   . Subclinical hypothyroidism     Patient Active Problem List   Diagnosis Date Noted  . Elevated TSH 08/12/2016  . Hypertension   . Hyperlipidemia   . Anxiety   . Depression     Past Surgical History:  Procedure Laterality Date  . SHOULDER SURGERY    . TUBAL LIGATION      OB History   No obstetric history on file.      Home Medications    Prior to Admission medications   Medication Sig Start Date End Date Taking? Authorizing Provider  HYDROcodone-acetaminophen (NORCO) 5-325 MG tablet Take 1 tablet by mouth every 6 (six) hours as needed for moderate pain. 10/31/18   Robyn Haber, MD  ibuprofen (ADVIL,MOTRIN) 800 MG tablet Take 1 tablet (800 mg total) by mouth 3 (three) times daily with meals. 06/19/17   Caccavale, Sophia, PA-C  ondansetron (ZOFRAN-ODT) 4 MG disintegrating tablet Take 1 tablet (4 mg total) by mouth every 8 (eight) hours as needed for nausea or vomiting. 10/07/18   Zigmund Gottron, NP  linaclotide Rolan Lipa) 145 MCG CAPS capsule Take 145 mcg by mouth daily before breakfast.  10/31/18  [provider]    Family History No family history on file.  Social History Social History   Tobacco Use  . Smoking  status: Never Smoker  . Smokeless tobacco: Never Used  Substance Use Topics  . Alcohol use: No  . Drug use: No     Allergies   Patient has no known allergies.   Review of Systems Review of Systems  Musculoskeletal: Positive for gait problem.  All other systems reviewed and are negative.    Physical Exam Triage Vital Signs ED Triage Vitals  Enc Vitals Group     BP 10/31/18 1731 (!) 189/106     Pulse Rate 10/31/18 1731 73     Resp 10/31/18 1731 18     Temp 10/31/18 1731 98.3 F (36.8 C)     Temp src --      SpO2 10/31/18 1731 99 %     Weight --      Height --      Head Circumference --      Peak Flow --      Pain Score 10/31/18 1732 8     Pain Loc --      Pain Edu? --      Excl. in [place]? --    No data found.  Updated Vital Signs BP (!) 189/106   Pulse 73   Temp 98.3 F (36.8 C)   Resp 18  LMP 11/16/2010   SpO2 99%    Physical Exam Vitals signs and nursing note reviewed.  Constitutional:      Appearance: Normal appearance. She is normal weight.  Eyes:     Conjunctiva/sclera: Conjunctivae normal.  Musculoskeletal: Normal range of motion.  Skin:    General: Skin is warm and dry.     Comments: The left great toe cuticle is surrounded by about 5 mm of ecchymosis.  The nail is cut painted so I cannot examine it.  Neurological:     General: No focal deficit present.     Mental Status: She is alert and oriented to person, place, and time.  Psychiatric:        Mood and Affect: Mood normal.        Behavior: Behavior normal.      UC Treatments / Results  Labs (all labs ordered are listed, but only abnormal results are displayed) Labs Reviewed - No data to display  EKG None  Radiology Left great toe x-ray is negative  Procedures Procedures (including critical care time)  Medications Ordered in UC Medications - No data to display  Initial Impression / Assessment and Plan / UC Course  I have reviewed the triage vital signs and the nursing notes.   Pertinent labs & imaging results that were available during my care of the patient were reviewed by me and considered in my medical decision making (see chart for details).    Final Clinical Impressions(s) / UC Diagnoses   Final diagnoses:  Subungual hematoma of foot, left, initial encounter     Discharge Instructions     No fracture is seen on the x-ray    ED Prescriptions    Medication Sig Dispense Auth. Provider   HYDROcodone-acetaminophen (NORCO) 5-325 MG tablet Take 1 tablet by mouth every 6 (six) hours as needed for moderate pain. 12 tablet Elvina SidleLauenstein, Fleta Borgeson, MD     Controlled Substance Prescriptions [place] Controlled Substance Registry consulted? Not Applicable   Elvina SidleLauenstein, Shrita Thien, MD 10/31/18 [place] [id number]

## 2018-10-31 NOTE — ED Triage Notes (Signed)
Pt states on fathers day she kicked a speaker over and it landed on her L foot. Pain and swelling to L foot, bruising noted on big toe.

## 2019-02-21 ENCOUNTER — Other Ambulatory Visit: Payer: Self-pay

## 2019-02-21 ENCOUNTER — Encounter (HOSPITAL_COMMUNITY): Payer: Self-pay

## 2019-02-21 ENCOUNTER — Ambulatory Visit (INDEPENDENT_AMBULATORY_CARE_PROVIDER_SITE_OTHER): Payer: Medicaid Other

## 2019-02-21 ENCOUNTER — Ambulatory Visit (HOSPITAL_COMMUNITY)
Admission: EM | Admit: 2019-02-21 | Discharge: 2019-02-21 | Disposition: A | Discharge status: Home / Self Care | Payer: Medicaid Other | Attending: Family Medicine | Admitting: Family Medicine

## 2019-02-21 DIAGNOSIS — R05 Cough: Secondary | ICD-10-CM

## 2019-02-21 DIAGNOSIS — I10 Essential (primary) hypertension: Secondary | ICD-10-CM

## 2019-02-21 DIAGNOSIS — Z20828 Contact with and (suspected) exposure to other viral communicable diseases: Secondary | ICD-10-CM | POA: Insufficient documentation

## 2019-02-21 DIAGNOSIS — R0981 Nasal congestion: Secondary | ICD-10-CM | POA: Diagnosis not present

## 2019-02-21 DIAGNOSIS — R059 Cough, unspecified: Secondary | ICD-10-CM

## 2019-02-21 MED ORDER — AMLODIPINE BESYLATE 5 MG PO TABS: 5.0000 mg | ORAL_TABLET | Freq: Every day | ORAL | 0 refills | Status: DC

## 2019-02-21 MED ORDER — HYDROCODONE-HOMATROPINE 5-1.5 MG/5ML PO SYRP: 5.0000 mL | ORAL_SOLUTION | Freq: Four times a day (QID) | ORAL | 0 refills | Status: DC | PRN

## 2019-02-21 NOTE — ED Triage Notes (Signed)
Pt presents with ongoing productive cough and chest congestion for over a week with no relief with OTC medication.

## 2019-02-21 NOTE — Discharge Instructions (Addendum)
If your Covid-19 test is positive, you will get a phone call from [facility] regarding your results. If your Covid-19 test is negative, you will NOT get a phone call from [facility] with your results. You may view your results on MyChart. If you do not have a MyChart account, sign up instructions are in your discharge papers.  Be aware, pain medications may cause drowsiness. Please do not drive, operate heavy machinery or make important decisions while on this medication, it can cloud your judgement.

## 2019-02-24 LAB — NOVEL CORONAVIRUS, NAA (HOSP ORDER, SEND-OUT TO REF LAB; TAT 18-24 HRS): SARS-CoV-2, NAA: NOT DETECTED

## 2019-02-26 NOTE — ED Provider Notes (Signed)
[facility]   [id number] 02/21/19 Arrival Time: 1647  ASSESSMENT & PLAN:  1. Cough   2. Uncontrolled hypertension     See AVS for discharge instructions. COVID-19 testing sent. To self-quarantine until results are available.  Meds ordered this encounter  Medications  . HYDROcodone-homatropine (HYCODAN) 5-1.5 MG/5ML syrup    Sig: Take 5 mLs by mouth every 6 (six) hours as needed for cough.    Dispense:  90 mL    Refill:  0  . amLODipine (NORVASC) 5 MG tablet    Sig: Take 1 tablet (5 mg total) by mouth daily.    Dispense:  30 tablet    Refill:  0    Cough medication sedation precautions. Discussed typical duration of symptoms. OTC symptom care as needed. Ensure adequate fluid intake and rest.  Encouraged to est care with PCP to follow BP. May recheck here anytime.  Reviewed expectations re: course of current medical issues. Questions answered. Outlined signs and symptoms indicating need for more acute intervention. Patient verbalized understanding. After Visit Summary given.   SUBJECTIVE: History from: patient.  [name] is a 53 y.o. female who presents with complaint of mild nasal congestion, post-nasal drainage, and a persistent dry cough; without sore throat. Onset abrupt, a week ago; with mild fatigue and without body aches. SOB: none. Wheezing: none. Fever: absent. Overall normal PO intake without n/v. Known sick contacts or COVID-19 exposure: no. No specific or significant aggravating or alleviating factors reported. Cough is affecting sleep. OTC treatment: various without symptom relief.  Increased blood pressure noted today. Reports that she has been treated for hypertension in the past. Would like to restart a medication.  She reports no chest pain on exertion, no dyspnea on exertion, no swelling of ankles, no orthostatic dizziness or lightheadedness, no orthopnea or paroxysmal nocturnal dyspnea, no palpitations and no intermittent claudication  symptoms.   Social History   Tobacco Use  Smoking Status Never Smoker  Smokeless Tobacco Never Used    ROS: As per HPI. All other systems negative.    OBJECTIVE:  Vitals:   02/21/19 1701  BP: (!) 172/100  Pulse: 60  Resp: 17  Temp: 98.6 F (37 C)  TempSrc: Oral  SpO2: 96%     General appearance: alert; NAD HEENT: nasal congestion; clear runny nose; throat irritation secondary to post-nasal drainage Neck: supple without LAD CV: RRR Lungs: unlabored respirations, symmetrical air entry without wheezing; cough: mild and dry Abd: soft Ext: no LE edema Skin: warm and dry Neuro: normal gait Psychological: alert and cooperative; normal mood and affect  Imaging: Dg Chest 2 View  Result Date: 02/21/2019 CLINICAL DATA:  Cough for 2 weeks.  Shortness of breath. EXAM: CHEST - 2 VIEW COMPARISON:  03/27/2016 FINDINGS: Remote lower right rib fractures. Midline trachea. Normal heart size and mediastinal contours. No pleural effusion or pneumothorax. Clear lungs. IMPRESSION: No acute cardiopulmonary disease.  For Electronically Signed   By: Jeronimo Greaves M.D.   On: 02/21/2019 17:45    No Known Allergies  Past Medical History:  Diagnosis Date  . Anxiety   . Depression   . Hyperlipidemia   . Hypertension   . Subclinical hypothyroidism    Family History  Family history unknown: Yes   Social History   Socioeconomic History  . Marital status: Single    Spouse name: Not on file  . Number of children: Not on file  . Years of education: Not on file  . Highest education level: Not  on file  Occupational History  . Not on file  Social Needs  . Financial resource strain: Not on file  . Food insecurity    Worry: Not on file    Inability: Not on file  . Transportation needs    Medical: Not on file    Non-medical: Not on file  Tobacco Use  . Smoking status: Never Smoker  . Smokeless tobacco: Never Used  Substance and Sexual Activity  . Alcohol use: No  . Drug use: No  .  Sexual activity: Not on file  Lifestyle  . Physical activity    Days per week: Not on file    Minutes per session: Not on file  . Stress: Not on file  Relationships  . Social Herbalist on phone: Not on file    Gets together: Not on file    Attends religious service: Not on file    Active member of club or organization: Not on file    Attends meetings of clubs or organizations: Not on file    Relationship status: Not on file  . Intimate partner violence    Fear of current or ex partner: Not on file    Emotionally abused: Not on file    Physically abused: Not on file    Forced sexual activity: Not on file  Other Topics Concern  . Not on file  Social History Narrative  . Not on file           Vanessa Kick, MD 02/26/19 [phone]

## 2019-09-28 ENCOUNTER — Emergency Department (HOSPITAL_COMMUNITY)
Admission: EM | Admit: 2019-09-28 | Discharge: 2019-09-28 | Disposition: A | Discharge status: Home / Self Care | Payer: Medicaid Other | Attending: Emergency Medicine | Admitting: Emergency Medicine

## 2019-09-28 ENCOUNTER — Emergency Department (HOSPITAL_COMMUNITY): Payer: Medicaid Other

## 2019-09-28 ENCOUNTER — Encounter (HOSPITAL_COMMUNITY): Payer: Self-pay

## 2019-09-28 ENCOUNTER — Other Ambulatory Visit: Payer: Self-pay

## 2019-09-28 DIAGNOSIS — I10 Essential (primary) hypertension: Secondary | ICD-10-CM | POA: Diagnosis not present

## 2019-09-28 DIAGNOSIS — Z79899 Other long term (current) drug therapy: Secondary | ICD-10-CM | POA: Insufficient documentation

## 2019-09-28 DIAGNOSIS — M25562 Pain in left knee: Secondary | ICD-10-CM | POA: Diagnosis not present

## 2019-09-28 DIAGNOSIS — M25561 Pain in right knee: Secondary | ICD-10-CM | POA: Insufficient documentation

## 2019-09-28 MED ORDER — MELOXICAM 7.5 MG PO TABS: 7.5000 mg | ORAL_TABLET | Freq: Every day | ORAL | 0 refills | Status: DC

## 2019-09-28 NOTE — ED Triage Notes (Signed)
Pt reports bilateral knee pain, L worse then the R. 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

## 2019-09-28 NOTE — Discharge Instructions (Signed)
Your x-rays were negative for any fractures.  Please make sure to follow-up with your orthopedic doctor.  Return to the ER for symptoms worsen.  Please use meloxicam as needed for pain.

## 2019-09-28 NOTE — ED Notes (Signed)
Pt is a one touch pt, see provider's assessment.

## 2019-09-28 NOTE — ED Provider Notes (Signed)
[facility] EMERGENCY DEPARTMENT Provider Note   CSN: [id number] Arrival date & time: 09/28/19  1915     History Chief Complaint  Patient presents with  . Knee Pain    [name] is a 54 y.o. female.  HPI 54 year old female with a history of hypertension, depression, hyperlipidemia, subclinical hypothyroidism, anxiety presents to the ER with bilateral knee pain.  She states she has a history of knee pain and follows with Dr. Ninfa Linden with orthopedics.  She states that she has been having some right knee pain for the last few weeks, but her biggest concern is her left knee which began hurting 2 days ago while she was at work.  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  She has been having pain to the knee since then.  She has still been able to ambulate, notes the worst of her hip pain going from a sitting to standing.  She denies any fevers, chills, IVDU, history of immunosuppression.  Right knee pain has been stable and chronic, states she hurt it while walking up the stairs.  She states that she does follow with Dr. Ninfa Linden, but it takes a long time for her to come to the ER and she is looking for something for pain.  She states that she has taken Tylenol/ibuprofen which have not helped.  States that she has a lot of NSAIDs at home that she does not even know the name of.  She does not have a history of gout.    Past Medical History:  Diagnosis Date  . Anxiety   . Depression   . Hyperlipidemia   . Hypertension   . Subclinical hypothyroidism     Patient Active Problem List   Diagnosis Date Noted  . Elevated TSH 08/12/2016  . Hypertension   . Hyperlipidemia   . Anxiety   . Depression     Past Surgical History:  Procedure Laterality Date  . SHOULDER SURGERY    . TUBAL LIGATION       OB History   No obstetric history on file.     Family History  Family history unknown: Yes     Social History   Tobacco Use  . Smoking status: Never Smoker  . Smokeless tobacco: Never Used  Substance Use Topics  . Alcohol use: No  . Drug use: No    Home Medications Prior to Admission medications   Medication Sig Start Date End Date Taking? Authorizing Provider  amLODipine (NORVASC) 5 MG tablet Take 1 tablet (5 mg total) by mouth daily. 02/21/19   Vanessa Kick, MD  HYDROcodone-homatropine (HYCODAN) 5-1.5 MG/5ML syrup Take 5 mLs by mouth every 6 (six) hours as needed for cough. 02/21/19   Vanessa Kick, MD  ibuprofen (ADVIL,MOTRIN) 800 MG tablet Take 1 tablet (800 mg total) by mouth 3 (three) times daily with meals. 06/19/17   Caccavale, Sophia, PA-C  meloxicam (MOBIC) 7.5 MG tablet Take 1 tablet (7.5 mg total) by mouth daily. 09/28/19   Garald Balding, PA-C  ondansetron (ZOFRAN-ODT) 4 MG disintegrating tablet Take 1 tablet (4 mg total) by mouth every 8 (eight) hours as needed for nausea or vomiting. 10/07/18   Zigmund Gottron, NP  linaclotide Rolan Lipa) 145 MCG CAPS capsule Take 145 mcg by mouth daily before breakfast.  10/31/18  [provider]    Allergies    Patient has no known allergies.  Review  of Systems   Review of Systems  Constitutional: Negative for chills and fever.  Musculoskeletal: Positive for arthralgias. Negative for joint swelling.  Skin: Negative for color change, rash and wound.    Physical Exam Updated Vital Signs BP (!) 182/98 (BP Location: Left Arm)   Pulse 73   Temp 98.7 F (37.1 C) (Oral)   Resp 14   LMP 11/16/2010   SpO2 98%   Physical Exam Vitals and nursing note reviewed.  Constitutional:      General: She is not in acute distress.    Appearance: She is well-developed.  HENT:     Head: Normocephalic and atraumatic.  Eyes:     Conjunctiva/sclera: Conjunctivae normal.  Cardiovascular:     Rate and Rhythm: Normal rate and regular rhythm.     Heart sounds: No murmur.  Pulmonary:     Effort: Pulmonary effort is normal. No  respiratory distress.     Breath sounds: Normal breath sounds.  Abdominal:     Palpations: Abdomen is soft.     Tenderness: There is no abdominal tenderness.  Musculoskeletal:        General: Swelling and tenderness present. No deformity.     Cervical back: Neck supple.     Comments: Nontender, nonerythematous, nonedematous right knee.  Full flexion and extension of right knee, 5/5 strength and sensations intact.  Left knee with questionable mild effusion, no overlying erythema, fluctuance, mildly tender to palpation.  Full flexion and extension of left knee though with pain, 5/5 strength, sensations intact.  Normal strength in plantar and dorsiflexion.  Patient was able to ambulate in the ER without difficulty.  Skin:    General: Skin is warm and dry.     Capillary Refill: Capillary refill takes less than 2 seconds.  Neurological:     General: No focal deficit present.     Mental Status: She is alert.     Sensory: No sensory deficit.     Motor: No weakness.     Gait: Gait normal.  Psychiatric:        Mood and Affect: Mood normal.        Behavior: Behavior normal.     ED Results / Procedures / Treatments   Labs (all labs ordered are listed, but only abnormal results are displayed) Labs Reviewed - No data to display  EKG None  Radiology DG Knee Complete 4 Views Left  Result Date: 09/28/2019 CLINICAL DATA:  Bilateral knee pain left greater than right. EXAM: LEFT KNEE - COMPLETE 4+ VIEW COMPARISON:  None. FINDINGS: No evidence of fracture, dislocation, or joint effusion. No evidence of arthropathy or other focal bone abnormality. Soft tissues are unremarkable. IMPRESSION: Negative. Electronically Signed   By: Aram Candela M.D.   On: 09/28/2019 19:50   DG Knee Complete 4 Views Right  Result Date: 09/28/2019 CLINICAL DATA:  Bilateral knee pain, left greater than right. EXAM: RIGHT KNEE - COMPLETE 4+ VIEW COMPARISON:  None. FINDINGS: No evidence of fracture, dislocation, or  joint effusion. No evidence of arthropathy or other focal bone abnormality. Soft tissues are unremarkable. IMPRESSION: Negative. Electronically Signed   By: Aram Candela M.D.   On: 09/28/2019 19:50    Procedures Procedures (including critical care time)  Medications Ordered in ED Medications - No data to display  ED Course  I have reviewed the triage vital signs and the nursing notes.  Pertinent labs & imaging results that were available during my care of the patient were reviewed by me  and considered in my medical decision making (see chart for details).    MDM Rules/Calculators/A&P                     54 year old female with bilateral knee pain, right being more chronic and left being more acute. Physical exam with mild tenderness to palpation and slightly decreased range of motion secondary to pain in the left knee, but patient was still able to fully flex and extend.  Right knee without tenderness, normal range of motion without significant pain.  No overlying cellulitis, fluctuance, erythema.  Doubt septic joint or gout.  She is without systemic symptoms.  X-ray negative for obvious fractures or dislocations.  Offered the patient a Toradol injection which she refused.  Patient states that she has sleeves at home and crutches as well.  I encouraged her to follow-up with Dr. Magnus Ivan as soon as possible.  I explained to the patient that opioid medications are not indicated for joint pain in the ER.  I prescribed her some meloxicam which she does not think she has tried.  She is agreeable to this.  Turn precautions given.  Patient voices understanding and is agreeable to this plan.  At this stage in the ED course, the patient has been medically screened and is stable for discharge.  Of note, the patient was hypertensive in the ER, but with no signs of hypertensive urgency/emergency.  I discussed her consistent high blood pressure during her ED visits.  She states that she is compliant with  her medications, I encouraged her to follow-up with her PCP for further evaluation of this.  She voices understanding and is agreeable.   Final Clinical Impression(s) / ED Diagnoses Final diagnoses:  Acute pain of both knees    Rx / DC Orders ED Discharge Orders         Ordered    meloxicam (MOBIC) 7.5 MG tablet  Daily     09/28/19 2008           Leone Brand 09/28/19 2042    Rolan Bucco, MD 09/28/19 2321

## 2019-09-28 NOTE — ED Notes (Signed)
Pt is in Xray, called, they will bring her back to the room when done.

## 2019-10-17 ENCOUNTER — Encounter (HOSPITAL_COMMUNITY): Payer: Self-pay

## 2019-10-17 ENCOUNTER — Ambulatory Visit (INDEPENDENT_AMBULATORY_CARE_PROVIDER_SITE_OTHER): Payer: Medicaid Other

## 2019-10-17 ENCOUNTER — Other Ambulatory Visit: Payer: Self-pay

## 2019-10-17 ENCOUNTER — Ambulatory Visit (HOSPITAL_COMMUNITY)
Admission: EM | Admit: 2019-10-17 | Discharge: 2019-10-17 | Disposition: A | Discharge status: Home / Self Care | Payer: Medicaid Other | Attending: Family Medicine | Admitting: Family Medicine

## 2019-10-17 DIAGNOSIS — M79661 Pain in right lower leg: Secondary | ICD-10-CM | POA: Diagnosis not present

## 2019-10-17 DIAGNOSIS — M25561 Pain in right knee: Secondary | ICD-10-CM | POA: Diagnosis not present

## 2019-10-17 DIAGNOSIS — M79604 Pain in right leg: Secondary | ICD-10-CM

## 2019-10-17 MED ORDER — IBUPROFEN 800 MG PO TABS: 800.0000 mg | ORAL_TABLET | Freq: Three times a day (TID) | ORAL | 0 refills | Status: DC

## 2019-10-17 MED ORDER — HYDROCODONE-ACETAMINOPHEN 5-325 MG PO TABS: 1.0000 | ORAL_TABLET | Freq: Four times a day (QID) | ORAL | 0 refills | Status: DC | PRN

## 2019-10-17 NOTE — ED Triage Notes (Signed)
Pt states she standing on arm of chair to kill a fly and fell onto right leg and heard it crack three timesx3 days ago. Pt c/o 10/10 throbbing pain in right leg. Pt came to exam room in wheel chair. Pt states leg is numb and her toes are tingling. Pt has 2+ edema of RLE. Pt has 2+ right pedal pulse, pt able to wiggle toes, extremity is warm, sensation intact.

## 2019-10-17 NOTE — Discharge Instructions (Signed)
Believe you may have a torn meniscus or medial collateral ligament. We will have you wear the knee immobilizer and be nonweightbearing until follow-up with sports medicine Giving you some medication for pain Rest, ice, elevate

## 2019-10-17 NOTE — ED Provider Notes (Signed)
[facility]    CSN: [id number] Arrival date & time: 10/17/19  1031      History   Chief Complaint Chief Complaint  Patient presents with  . Leg Injury    HPI [name] is a 54 y.o. female.   Patient is a 54 year old female that presents today with right leg pain.  Pain is located to the medial aspect of the right knee with radiation to right calf area.  There is generalized swelling.  This started after falling off of a chair and twisting the leg.  Reporting she felt a pop and heard a cracking.  She has had throbbing pain to the leg since.  She took 800 mg of ibuprofen with some relief.  Pain worse with ambulation and standing.  Feels like her leg is going to give out on her.  Reports mild numbness and tingling.  ROS per HPI      Past Medical History:  Diagnosis Date  . Anxiety   . Depression   . Hyperlipidemia   . Hypertension   . Subclinical hypothyroidism     Patient Active Problem List   Diagnosis Date Noted  . Elevated TSH 08/12/2016  . Hypertension   . Hyperlipidemia   . Anxiety   . Depression     Past Surgical History:  Procedure Laterality Date  . SHOULDER SURGERY    . TUBAL LIGATION      OB History   No obstetric history on file.      Home Medications    Prior to Admission medications   Medication Sig Start Date End Date Taking? Authorizing Provider  amLODipine (NORVASC) 5 MG tablet Take 1 tablet (5 mg total) by mouth daily. 02/21/19   Mardella Layman, MD  HYDROcodone-acetaminophen (NORCO/VICODIN) 5-325 MG tablet Take 1-2 tablets by mouth every 6 (six) hours as needed. 10/17/19   Dahlia Byes A, NP  ibuprofen (ADVIL) 800 MG tablet Take 1 tablet (800 mg total) by mouth 3 (three) times daily. 10/17/19   Janace Aris, NP  linaclotide Karlene Einstein) 145 MCG CAPS capsule Take 145 mcg by mouth daily before breakfast.  10/31/18  [provider]    Family History Family History  Problem Relation Age of Onset  . Healthy Mother   .  Heart disease Father     Social History Social History   Tobacco Use  . Smoking status: Never Smoker  . Smokeless tobacco: Never Used  Substance Use Topics  . Alcohol use: No  . Drug use: No     Allergies   Patient has no known allergies.   Review of Systems Review of Systems   Physical Exam Triage Vital Signs ED Triage Vitals  Enc Vitals Group     BP 10/17/19 1057 (!) 149/103     Pulse Rate 10/17/19 1057 88     Resp 10/17/19 1057 18     Temp 10/17/19 1057 98.4 F (36.9 C)     Temp Source 10/17/19 1057 Oral     SpO2 10/17/19 1057 100 %     Weight 10/17/19 1118 125 lb (56.7 kg)     Height 10/17/19 1118 5\' 3"  (1.6 m)     Head Circumference --      Peak Flow --      Pain Score 10/17/19 1117 10     Pain Loc --      Pain Edu? --      Excl. in GC? --    No data found.  Updated Vital Signs BP (!) 149/103 (BP Location: Left Arm)   Pulse 88   Temp 98.4 F (36.9 C) (Oral)   Resp 18   Ht 5\' 3"  (1.6 m)   Wt 125 lb (56.7 kg)   LMP 11/16/2010   SpO2 100%   BMI 22.14 kg/m   Visual Acuity Right Eye Distance:   Left Eye Distance:   Bilateral Distance:    Right Eye Near:   Left Eye Near:    Bilateral Near:     Physical Exam Vitals and nursing note reviewed.  Constitutional:      General: She is not in acute distress.    Appearance: Normal appearance. She is not ill-appearing, toxic-appearing or diaphoretic.  HENT:     Head: Normocephalic.     Nose: Nose normal.  Eyes:     Conjunctiva/sclera: Conjunctivae normal.  Pulmonary:     Effort: Pulmonary effort is normal.  Musculoskeletal:        General: Swelling and tenderness present.     Cervical back: Normal range of motion.     Right knee: Swelling and bony tenderness present. No deformity, effusion, erythema, ecchymosis or lacerations. Decreased range of motion. Tenderness present over the MCL.       Legs:     Comments: Very TTP with generalized swelling.   Skin:    General: Skin is warm and dry.      Findings: No rash.  Neurological:     Mental Status: She is alert.  Psychiatric:        Mood and Affect: Mood normal.      UC Treatments / Results  Labs (all labs ordered are listed, but only abnormal results are displayed) Labs Reviewed - No data to display  EKG   Radiology DG Tibia/Fibula Right  Result Date: 10/17/2019 CLINICAL DATA:  Pain following fall EXAM: RIGHT TIBIA AND FIBULA - 2 VIEW COMPARISON:  Right knee radiographs Sep 28, 2019 FINDINGS: Frontal and lateral views were obtained. No fracture or dislocation. No abnormal periosteal reaction. Joint spaces appear normal. IMPRESSION: No fracture or dislocation.  No evident arthropathy. Electronically Signed   By: Lowella Grip III M.D.   On: 10/17/2019 12:15    Procedures Procedures (including critical care time)  Medications Ordered in UC Medications - No data to display  Initial Impression / Assessment and Plan / UC Course  I have reviewed the triage vital signs and the nursing notes.  Pertinent labs & imaging results that were available during my care of the patient were reviewed by me and considered in my medical decision making (see chart for details).     Right knee pain and right leg pain Patient with moderate pain and swelling to the medial aspect of both right knee and into calf. X-ray did not reveal any acute abnormalities today Most likely meniscus tear versus MCL injury. Patient has knee brace at home that she can wear and crutches to use for nonweightbearing Doing 800 mg of ibuprofen every 8 hours and hydrocodone for severe pain as needed. Referral put in for sports medicine for follow-up Rest, ice, elevate  Final Clinical Impressions(s) / UC Diagnoses   Final diagnoses:  Acute pain of right knee  Acute pain of right lower extremity     Discharge Instructions     Believe you may have a torn meniscus or medial collateral ligament. We will have you wear the knee immobilizer and be  nonweightbearing until follow-up with sports medicine Giving you some medication  for pain Rest, ice, elevate     ED Prescriptions    Medication Sig Dispense Auth. Provider   ibuprofen (ADVIL) 800 MG tablet Take 1 tablet (800 mg total) by mouth 3 (three) times daily. 21 tablet [name] A, NP   HYDROcodone-acetaminophen (NORCO/VICODIN) 5-325 MG tablet Take 1-2 tablets by mouth every 6 (six) hours as needed. 12 tablet Dannie Hattabaugh A, NP     I have reviewed the PDMP during this encounter.   Janace Aris, NP 10/17/19 1517

## 2019-10-23 ENCOUNTER — Other Ambulatory Visit: Payer: Self-pay

## 2019-10-23 ENCOUNTER — Ambulatory Visit: Payer: Self-pay

## 2019-10-23 ENCOUNTER — Encounter: Payer: Self-pay | Admitting: Sports Medicine

## 2019-10-23 ENCOUNTER — Ambulatory Visit: Payer: Self-pay | Admitting: Sports Medicine

## 2019-10-23 VITALS — BP 148/100 | Ht 63.0 in | Wt 125.0 lb

## 2019-10-23 DIAGNOSIS — M25561 Pain in right knee: Secondary | ICD-10-CM

## 2019-10-23 MED ORDER — MELOXICAM 15 MG PO TABS: ORAL_TABLET | ORAL | 2 refills | Status: DC

## 2019-10-23 NOTE — Patient Instructions (Signed)
On the ultrasound of your knee I saw that you had fluid within the knee.  There also appeared to be a tear through the meniscus on the inner portion of your knee where most of your pain is. -Wear the knee compression sleeve.  This will help with the swelling and will help provide extra stability for the knee -I have sent in a prescription for meloxicam for you.  This is a strong anti-inflammatory.  Do not take other anti-inflammatories such as Aleve or ibuprofen with it -Work on the home exercises shown to you at today's visit  I would like to see you back in 1 to 2 weeks.  If you are still having pain at that time we can consider an injection into the knee and/or MRI of the knee.  If you change your mind and would like to have the fluid in your knee drained before this, please give [place] a call to schedule this and we can fit you in to get this done

## 2019-10-23 NOTE — Progress Notes (Addendum)
PCP: Donita Brooks, MD  Subjective:   HPI: Patient is a 54 y.o. female here for evaluation of right knee pain.  2 weeks ago patient fell off of a chair and landed on her right knee.  She was seen at urgent care had x-rays done.  X-rays were negative.  Patient notes most the pain is along the medial joint line.  She notes some instability and locking/catching of the knee.  She also has pain in the posterior aspect of her knee.  She endorses swelling as well as bruising in the dependent portion of her leg.  Patient denies any numbness or tingling in her knee.  She has not done anything for the pain other than take ibuprofen.  She has tried a knee brace but this has not helped improve her stability or her pain.   Review of Systems: See HPI above.  Past Medical History:  Diagnosis Date  . Anxiety   . Depression   . Hyperlipidemia   . Hypertension   . Subclinical hypothyroidism     Current Outpatient Medications on File Prior to Visit  Medication Sig Dispense Refill  . amLODipine (NORVASC) 5 MG tablet Take 1 tablet (5 mg total) by mouth daily. 30 tablet 0  . HYDROcodone-acetaminophen (NORCO/VICODIN) 5-325 MG tablet Take 1-2 tablets by mouth every 6 (six) hours as needed. 12 tablet 0  . ibuprofen (ADVIL) 800 MG tablet Take 1 tablet (800 mg total) by mouth 3 (three) times daily. 21 tablet 0  . [DISCONTINUED] linaclotide (LINZESS) 145 MCG CAPS capsule Take 145 mcg by mouth daily before breakfast.     No current facility-administered medications on file prior to visit.    Past Surgical History:  Procedure Laterality Date  . SHOULDER SURGERY    . TUBAL LIGATION      No Known Allergies  Social History   Socioeconomic History  . Marital status: Single    Spouse name: Not on file  . Number of children: Not on file  . Years of education: Not on file  . Highest education level: Not on file  Occupational History  . Not on file  Tobacco Use  . Smoking status: Never Smoker  .  Smokeless tobacco: Never Used  Substance and Sexual Activity  . Alcohol use: No  . Drug use: No  . Sexual activity: Not on file  Other Topics Concern  . Not on file  Social History Narrative  . Not on file   Social Determinants of Health   Financial Resource Strain:   . Difficulty of Paying Living Expenses:   Food Insecurity:   . Worried About Programme researcher, broadcasting/film/video in the Last Year:   . Barista in the Last Year:   Transportation Needs:   . Freight forwarder (Medical):   Marland Kitchen Lack of Transportation (Non-Medical):   Physical Activity:   . Days of Exercise per Week:   . Minutes of Exercise per Session:   Stress:   . Feeling of Stress :   Social Connections:   . Frequency of Communication with Friends and Family:   . Frequency of Social Gatherings with Friends and Family:   . Attends Religious Services:   . Active Member of Clubs or Organizations:   . Attends Banker Meetings:   Marland Kitchen Marital Status:   Intimate Partner Violence:   . Fear of Current or Ex-Partner:   . Emotionally Abused:   Marland Kitchen Physically Abused:   . Sexually Abused:  Family History  Problem Relation Age of Onset  . Healthy Mother   . Heart disease Father         Objective:  Physical Exam: Ht 5\' 3"  (1.6 m)   Wt 125 lb (56.7 kg)   LMP 11/16/2010   BMI 22.14 kg/m  Gen: NAD, comfortable in exam room Lungs: Breathing comfortably on room air Knee Exam Right -Inspection: Swelling noted throughout the knee.  She also had dependent bruising posteriorly and inferior to her right knee -Palpation: Tenderness palpation along the medial and lateral joint lines. -ROM: Extension: 20 degrees; Flexion: 100 degrees -Strength: Unable to assess due to pain -Special Tests: Varus Stress: Negative; Valgus Stress: Negative.  Unable to assess Lachman's or McMurray's due to pain -Limb neurovascularly intact, no instability noted  Limited diagnostic ultrasound of the right  knee Findings: -Moderate-sized joint effusion seen within the suprapatellar pouch -Normal appearance of the quadricep and patellar tendon -Normal appearance of the trochlear groove -Normal appearance of the lateral meniscus -Discrete tear seen throughout the medial meniscus Impression: -Ultrasound findings consistent with medial meniscus tear and joint effusion    Assessment & Plan:  Patient is a 54 y.o. female here for evaluation of right knee pain  1.  Right knee pain -Ultrasound findings concerning for medial meniscus tear.  Patient also found to have a moderate-sized joint effusion -Patient offered aspiration of the knee with subsequent corticosteroid injection.  Patient declined this today. -Patient given a knee compression sleeve -Patient given a prescription for meloxicam -Patient given home exercise program  Patient will follow up in 2 weeks.  At that time if she still having pain we will consider aspiration injection the knee versus obtaining MRI to evaluate for meniscus tear  I was the preceptor for this visit and available for immediate consultation Shellia Cleverly, DO

## 2019-10-29 ENCOUNTER — Telehealth: Payer: Self-pay | Admitting: Family Medicine

## 2019-10-29 MED ORDER — MELOXICAM 15 MG PO TABS: ORAL_TABLET | ORAL | 2 refills | Status: DC

## 2019-10-29 NOTE — Telephone Encounter (Signed)
Patient called upset because when she went to pick up [facility] prescription from pharmacy was advised Dr. Lyn Hollingshead is not listed w/ Medicaid as a provider so her Rx for :    meloxicam (MOBIC) 15 MG tablet [[id number]]   Order Details Dose, Route, Frequency: As Directed  Dispense Quantity: 40 tablet Refills: 2       Sig: Take one pill a day with food for 7 days and then prn thereafter      Start Date: 10/23/19 End Date: --  Written Date: 10/23/19 Expiration Date: 10/22/20  Providers  Ordering and Authorizing Provider: Christena Deem, MD NPI: [id number]  DEA #: [id number]  Ordering User:  Annita Brod, CMA    ------ ( Is Not Covered ) & pt says needs Rx for her knee pain.  Forwarding message to med asst to contact :   [id number] - [place], [place] - [address] AT [facility] [address]  [address], [place] [address]  Phone:  [phone] Fax:  [phone]    --glh

## 2019-11-01 ENCOUNTER — Ambulatory Visit: Payer: Medicaid Other | Admitting: Sports Medicine

## 2020-02-17 ENCOUNTER — Ambulatory Visit (INDEPENDENT_AMBULATORY_CARE_PROVIDER_SITE_OTHER): Payer: Medicaid Other

## 2020-02-17 ENCOUNTER — Ambulatory Visit (HOSPITAL_COMMUNITY)
Admission: EM | Admit: 2020-02-17 | Discharge: 2020-02-17 | Disposition: A | Discharge status: Home / Self Care | Payer: Medicaid Other | Attending: Family Medicine | Admitting: Family Medicine

## 2020-02-17 ENCOUNTER — Encounter (HOSPITAL_COMMUNITY): Payer: Self-pay

## 2020-02-17 ENCOUNTER — Other Ambulatory Visit: Payer: Self-pay

## 2020-02-17 DIAGNOSIS — M25511 Pain in right shoulder: Secondary | ICD-10-CM | POA: Diagnosis not present

## 2020-02-17 DIAGNOSIS — S46911A Strain of unspecified muscle, fascia and tendon at shoulder and upper arm level, right arm, initial encounter: Secondary | ICD-10-CM

## 2020-02-17 MED ORDER — MELOXICAM 15 MG PO TABS: 15.0000 mg | ORAL_TABLET | Freq: Every day | ORAL | 0 refills | Status: DC

## 2020-02-17 NOTE — Discharge Instructions (Signed)
Your x ray was normal Most likely strained.  Rest, Ice, gentle stretching.  Meloxicam daily.  Follow up as needed for continued or worsening symptoms
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
## 2020-06-16 ENCOUNTER — Other Ambulatory Visit: Payer: Medicaid Other

## 2020-06-16 ENCOUNTER — Other Ambulatory Visit: Payer: Self-pay

## 2020-06-16 DIAGNOSIS — Z20822 Contact with and (suspected) exposure to covid-19: Secondary | ICD-10-CM

## 2020-06-17 ENCOUNTER — Telehealth: Payer: Self-pay | Admitting: Orthopaedic Surgery

## 2020-06-17 ENCOUNTER — Ambulatory Visit (INDEPENDENT_AMBULATORY_CARE_PROVIDER_SITE_OTHER): Payer: Medicaid Other

## 2020-06-17 ENCOUNTER — Encounter: Payer: Self-pay | Admitting: Orthopaedic Surgery

## 2020-06-17 ENCOUNTER — Ambulatory Visit (INDEPENDENT_AMBULATORY_CARE_PROVIDER_SITE_OTHER): Payer: Medicaid Other | Admitting: Orthopaedic Surgery

## 2020-06-17 DIAGNOSIS — M25521 Pain in right elbow: Secondary | ICD-10-CM

## 2020-06-17 DIAGNOSIS — M7711 Lateral epicondylitis, right elbow: Secondary | ICD-10-CM

## 2020-06-17 LAB — SARS-COV-2, NAA 2 DAY TAT

## 2020-06-17 LAB — NOVEL CORONAVIRUS, NAA: SARS-CoV-2, NAA: DETECTED — AB

## 2020-06-17 MED ORDER — NABUMETONE 750 MG PO TABS: 750.0000 mg | ORAL_TABLET | Freq: Two times a day (BID) | ORAL | 1 refills | Status: DC | PRN

## 2020-06-17 MED ORDER — METHYLPREDNISOLONE 4 MG PO TABS: ORAL_TABLET | ORAL | 0 refills | Status: DC

## 2020-06-17 NOTE — Telephone Encounter (Signed)
I called and spoke with the pharmacist. She stated the dose pack was ready but the relafen requires PA. I called and discussed this with the pt and informed her the we will work on the [place]. Pt state understanding.

## 2020-06-17 NOTE — Progress Notes (Signed)
Office Visit Note   Patient: [name]           Date of Birth: [birth year]           MRN: [id number] Visit Date: 06/17/2020              Requested by: Donita Brooks, MD [address] [place],  [place] [address] PCP: Donita Brooks, MD   Assessment & Plan: Visit Diagnoses:  1. Pain in right elbow   2. Lateral epicondylitis, right elbow     Plan: Her signs and symptoms seem to be more consistent with lateral epicondylitis of the right elbow.  I showed her some stretching exercises to try and we will try a 6-day steroid taper as well as Relafen as an anti-inflammatory and I did recommend a steroid injection over the area of maximal tenderness and she agreed to this and tolerated well.  She should also try topical Voltaren gel and heat and perform stretching exercises on a routine basis.  She should also try to avoid repetitive activities with her palm down.  All questions and concerns were answered and addressed.  Follow-up can be as needed.  I would not hesitate to inject this area again in 2 months if needed.  Follow-Up Instructions: Return if symptoms worsen or fail to improve.   Orders:  Orders Placed This Encounter  Procedures  . Large Joint Inj  . Hand/UE Inj  . XR Elbow 2 Views Right   Meds ordered this encounter  Medications  . methylPREDNISolone (MEDROL) 4 MG tablet    Sig: Medrol dose pack. Take as instructed    Dispense:  21 tablet    Refill:  0  . nabumetone (RELAFEN) 750 MG tablet    Sig: Take 1 tablet (750 mg total) by mouth 2 (two) times daily as needed.    Dispense:  60 tablet    Refill:  1      Procedures: Hand/UE Inj: R elbow for lateral epicondylitis on 06/17/2020 1:10 PM      Clinical Data: No additional findings.   Subjective: Chief Complaint  Patient presents with  . Right Elbow - Follow-up  The patient comes in today with significant right elbow pain over the lateral epicondyle area.  Is been worsening for about 4 months  now after doing a lot of repetitive activities.  She does report a bad injury to her shoulder years ago.  She denies any numbness tingling in her hand.  It hurts more with activities with rotation of her elbow and lifting.  HPI  Review of Systems There is current no change in her medical status acutely.  She denies any headache, chest pain, shortness of breath, fever, chills, nausea, vomiting  Objective: Vital Signs: LMP 11/16/2010   Physical Exam She is alert and oriented x3 and in no acute distress Ortho Exam Examination of her right elbow shows no redness or swelling.  There is significant pain over the lateral epicondyle area of the elbow.  There is no pain medially.  There is a negative Tinel's sign at the cubital tunnel.  Her triceps function is intact.  Distally her motor sensory exam is intact.  She has severe pain with rotation of the elbow joint as well. Specialty Comments:  No specialty comments available.  Imaging: XR Elbow 2 Views Right  Result Date: 06/17/2020 2 views of the right elbow show no acute findings.    PMFS History: Patient Active Problem List  Diagnosis Date Noted  . Elevated TSH 08/12/2016  . Hypertension   . Hyperlipidemia   . Anxiety   . Depression    Past Medical History:  Diagnosis Date  . Anxiety   . Depression   . Hyperlipidemia   . Hypertension   . Subclinical hypothyroidism     Family History  Problem Relation Age of Onset  . Healthy Mother   . Heart disease Father     Past Surgical History:  Procedure Laterality Date  . SHOULDER SURGERY    . TUBAL LIGATION     Social History   Occupational History  . Not on file  Tobacco Use  . Smoking status: Never Smoker  . Smokeless tobacco: Never Used  Substance and Sexual Activity  . Alcohol use: No  . Drug use: No  . Sexual activity: Not on file

## 2020-06-17 NOTE — Telephone Encounter (Signed)
PA started

## 2020-06-17 NOTE — Telephone Encounter (Signed)
Pt called stating the pharmacy wont release her medrol for another 72 hrs and she would like for Dr.Blackman to call and see if he can get them to release it today? Pt would like a CB to be updated  [phone]

## 2020-06-18 ENCOUNTER — Telehealth: Payer: Self-pay

## 2020-06-18 MED ORDER — ETODOLAC 500 MG PO TABS: 500.0000 mg | ORAL_TABLET | Freq: Two times a day (BID) | ORAL | 3 refills | Status: DC | PRN

## 2020-06-18 NOTE — Telephone Encounter (Signed)
Called to discuss with patient about COVID-19 symptoms and the use of one of the available treatments for those with mild to moderate Covid symptoms and at a high risk of hospitalization.  Pt appears to qualify for outpatient treatment due to co-morbid conditions and/or a member of an at-risk group in accordance with the FDA Emergency Use Authorization.    Symptom onset: Unknown Vaccinated: No record in chart Booster? No record in chart Immunocompromised? No Qualifiers: HTN  Unable to reach pt - Left message with call back number [phone].  Esther Hardy

## 2020-06-18 NOTE — Telephone Encounter (Signed)
Pt's insurance denied the nabumetone. Do you want to try something else for her?

## 2020-06-18 NOTE — Telephone Encounter (Signed)
I sent in a different anti-inflammatory for her to try.  If her insurance does not cover this the only thing I can recommend is Aleve twice a day or 2-3 Advil 2-3 times a day with meals.

## 2020-06-18 NOTE — Telephone Encounter (Signed)
Pt called and advised and stated understanding  

## 2020-10-01 ENCOUNTER — Other Ambulatory Visit: Payer: Self-pay

## 2020-10-01 ENCOUNTER — Encounter (HOSPITAL_COMMUNITY): Payer: Self-pay | Admitting: Emergency Medicine

## 2020-10-01 ENCOUNTER — Ambulatory Visit (HOSPITAL_COMMUNITY)
Admission: EM | Admit: 2020-10-01 | Discharge: 2020-10-01 | Disposition: A | Discharge status: Home / Self Care | Payer: Medicaid Other | Attending: Nurse Practitioner | Admitting: Nurse Practitioner

## 2020-10-01 DIAGNOSIS — L255 Unspecified contact dermatitis due to plants, except food: Secondary | ICD-10-CM

## 2020-10-01 MED ORDER — DEXAMETHASONE SODIUM PHOSPHATE 10 MG/ML IJ SOLN
10.0000 mg | Freq: Once | INTRAMUSCULAR | Status: AC
  Administered 2020-10-01: 10 mg via INTRAMUSCULAR

## 2020-10-01 MED ORDER — TRIAMCINOLONE ACETONIDE 0.1 % EX CREA: 1.0000 "application " | TOPICAL_CREAM | Freq: Two times a day (BID) | CUTANEOUS | 0 refills | Status: DC

## 2020-10-01 MED ORDER — PREDNISONE 10 MG (21) PO TBPK: ORAL_TABLET | Freq: Every day | ORAL | 0 refills | Status: DC

## 2020-10-01 MED ORDER — DEXAMETHASONE SODIUM PHOSPHATE 10 MG/ML IJ SOLN
INTRAMUSCULAR | Status: AC
  Filled 2020-10-01: qty 1

## 2020-10-01 NOTE — ED Triage Notes (Signed)
Patient c/o possible poison oak on the left side of face, right arm and left torso since Sunday.  Patient has applied Benadryl cream, using DOVE soap.  Patient c/o pain.

## 2020-10-01 NOTE — ED Provider Notes (Addendum)
[facility]    CSN: [id number] Arrival date & time: 10/01/20  1010      History   Chief Complaint Chief Complaint  Patient presents with  . Rash    HPI [name] is a 55 y.o. female.   Subjective:   [name] is a 55 y.o. female who presents for evaluation of rash. Rash started 4 days ago. Rash is scattered to her bilateral arms, back, face and neck. Lesions are pink in color and are of raised texture. Rash has not changed over time.  Rash is painful and is pruritic. Patient denies: arthralgia, fever, headache, myalgia, nausea or vomiting. Patient has not had previous evaluation of rash. Rash started one day after playing outside with her grandson. She has tried benadryl cream and calamine lotion without any relief in symptoms. Patient's grandson also has a rash but its not as bad as hers.   The following portions of the patient's history were reviewed and updated as appropriate: allergies, current medications, past family history, past medical history, past social history, past surgical history and problem list.           Past Medical History:  Diagnosis Date  . Anxiety   . Depression   . Hyperlipidemia   . Hypertension   . Subclinical hypothyroidism     Patient Active Problem List   Diagnosis Date Noted  . Elevated TSH 08/12/2016  . Hypertension   . Hyperlipidemia   . Anxiety   . Depression     Past Surgical History:  Procedure Laterality Date  . SHOULDER SURGERY    . TUBAL LIGATION      OB History   No obstetric history on file.      Home Medications    Prior to Admission medications   Medication Sig Start Date End Date Taking? Authorizing Provider  predniSONE (STERAPRED UNI-PAK 21 TAB) 10 MG (21) TBPK tablet Take by mouth daily. Take 6 tabs by mouth daily  for 2 days, then 5 tabs for 2 days, then 4 tabs for 2 days, then 3 tabs for 2 days, 2 tabs for 2 days, then 1 tab by mouth daily for 2 days 10/01/20  Yes Lurline Idol,  FNP  triamcinolone cream (KENALOG) 0.1 % Apply 1 application topically 2 (two) times daily. Apply to affected areas twice a day 10/01/20  Yes Lurline Idol, FNP  amLODipine (NORVASC) 5 MG tablet Take 1 tablet (5 mg total) by mouth daily. 02/21/19   Mardella Layman, MD  etodolac (LODINE) 500 MG tablet Take 1 tablet (500 mg total) by mouth 2 (two) times daily between meals as needed. 06/18/20   Kathryne Hitch, MD  meloxicam (MOBIC) 15 MG tablet Take 1 tablet (15 mg total) by mouth daily. 02/17/20   Bast, Gloris [place] A, NP  methylPREDNISolone (MEDROL) 4 MG tablet Medrol dose pack. Take as instructed 06/17/20   Kathryne Hitch, MD  nabumetone (RELAFEN) 750 MG tablet Take 1 tablet (750 mg total) by mouth 2 (two) times daily as needed. 06/17/20   Kathryne Hitch, MD  linaclotide Karlene Einstein) 145 MCG CAPS capsule Take 145 mcg by mouth daily before breakfast.  10/31/18  [provider]    Family History Family History  Problem Relation Age of Onset  . Healthy Mother   . Heart disease Father     Social History Social History   Tobacco Use  . Smoking status: Never Smoker  . Smokeless tobacco: Never Used  Substance Use Topics  .  Alcohol use: No  . Drug use: No     Allergies   Patient has no known allergies.   Review of Systems Review of Systems  Constitutional: Negative for fever.  Gastrointestinal: Negative for nausea and vomiting.  Musculoskeletal: Negative for arthralgias and myalgias.  Skin: Positive for rash.  All other systems reviewed and are negative.    Physical Exam Triage Vital Signs ED Triage Vitals  Enc Vitals Group     BP 10/01/20 1103 (!) 177/96     Pulse Rate 10/01/20 1103 (!) 57     Resp --      Temp 10/01/20 1103 98 F (36.7 C)     Temp Source 10/01/20 1103 Oral     SpO2 10/01/20 1103 98 %     Weight --      Height --      Head Circumference --      Peak Flow --      Pain Score 10/01/20 1105 8     Pain Loc --      Pain Edu? --       Excl. in GC? --    No data found.  Updated Vital Signs BP (!) 156/87 (BP Location: Left Arm)   Pulse (!) 57   Temp 98 F (36.7 C) (Oral)   LMP 11/16/2010   SpO2 98%   Visual Acuity Right Eye Distance:   Left Eye Distance:   Bilateral Distance:    Right Eye Near:   Left Eye Near:    Bilateral Near:     Physical Exam Vitals reviewed.  Constitutional:      Appearance: She is not ill-appearing or toxic-appearing.  HENT:     Head: Normocephalic.  Pulmonary:     Effort: Pulmonary effort is normal.  Musculoskeletal:        General: Normal range of motion.     Cervical back: Normal range of motion and neck supple.  Skin:    General: Skin is warm and dry.     Findings: Rash present.     Comments: Diffused, pruritic erythematous rash noted to the left side of the face, left side of the neck, chest, left flank area and bilateral arms.  Neurological:     General: No focal deficit present.     Mental Status: She is alert and oriented to person, place, and time.  Psychiatric:        Mood and Affect: Mood normal.        Behavior: Behavior normal.      UC Treatments / Results  Labs (all labs ordered are listed, but only abnormal results are displayed) Labs Reviewed - No data to display  EKG   Radiology No results found.  Procedures Procedures (including critical care time)  Medications Ordered in UC Medications  dexamethasone (DECADRON) injection 10 mg (10 mg Intramuscular Given 10/01/20 1149)    Initial Impression / Assessment and Plan / UC Course  I have reviewed the triage vital signs and the nursing notes.  Pertinent labs & imaging results that were available during my care of the patient were reviewed by me and considered in my medical decision making (see chart for details).    55 yo female presenting with an acute toxicodendron dermatitis.  Patient is uncomfortable due to the pruritic nature of the rash but nontoxic.  Afebrile.  Reassurance was given to  the patient. Will treat with an injection of Decadron here in the clinic followed by a prednisone taper.  Aveeno  baths. Benadryl prn for itching. Observe for signs of superimposed infection and systemic symptoms. Watch for signs of fever or worsening of the rash. Follow-up as needed.   Today's evaluation has revealed no signs of a dangerous process. Discussed diagnosis with patient and/or guardian. Patient and/or guardian aware of their diagnosis, possible red flag symptoms to watch out for and need for close follow up. Patient and/or guardian understands verbal and written discharge instructions. Patient and/or guardian comfortable with plan and disposition.  Patient and/or guardian has a clear mental status at this time, good insight into illness (after discussion and teaching) and has clear judgment to make decisions regarding their care  This care was provided during an unprecedented National Emergency due to the Novel Coronavirus (COVID-19) pandemic. COVID-19 infections and transmission risks place heavy strains on healthcare resources.  As this pandemic evolves, our facility, providers, and staff strive to respond fluidly, to remain operational, and to provide care relative to available resources and information. Outcomes are unpredictable and treatments are without well-defined guidelines. Further, the impact of COVID-19 on all aspects of urgent care, including the impact to patients seeking care for reasons other than COVID-19, is unavoidable during this national emergency. At this time of the global pandemic, management of patients has significantly changed, even for non-COVID positive patients given high local and regional COVID volumes at this time requiring high healthcare system and resource utilization. The standard of care for management of both COVID suspected and non-COVID suspected patients continues to change rapidly at the local, regional, national, and global levels. This patient was worked  up and treated to the best available but ever changing evidence and resources available at this current time.   Documentation was completed with the aid of voice recognition software. Transcription may contain typographical errors.  Final Clinical Impressions(s) / UC Diagnoses   Final diagnoses:  Toxicodendron dermatitis   Discharge Instructions   None    ED Prescriptions    Medication Sig Dispense Auth. Provider   predniSONE (STERAPRED UNI-PAK 21 TAB) 10 MG (21) TBPK tablet Take by mouth daily. Take 6 tabs by mouth daily  for 2 days, then 5 tabs for 2 days, then 4 tabs for 2 days, then 3 tabs for 2 days, 2 tabs for 2 days, then 1 tab by mouth daily for 2 days 42 tablet Leodis Alcocer, Lelon Mast, FNP   triamcinolone cream (KENALOG) 0.1 % Apply 1 application topically 2 (two) times daily. Apply to affected areas twice a day 15 g Lurline Idol, FNP     PDMP not reviewed this encounter.   Lurline Idol, FNP 10/01/20 1251    Lurline Idol, [place] 10/01/20 1252

## 2021-04-02 ENCOUNTER — Other Ambulatory Visit: Payer: Self-pay

## 2021-04-02 ENCOUNTER — Encounter (HOSPITAL_COMMUNITY): Payer: Self-pay

## 2021-04-02 ENCOUNTER — Ambulatory Visit (HOSPITAL_COMMUNITY)
Admission: EM | Admit: 2021-04-02 | Discharge: 2021-04-02 | Disposition: A | Discharge status: Home / Self Care | Payer: Medicaid Other

## 2021-04-02 DIAGNOSIS — R1111 Vomiting without nausea: Secondary | ICD-10-CM

## 2021-04-02 NOTE — Discharge Instructions (Signed)
I am sorry you are feeling unwell and I certainly hope you feel better.  I am happy to provide you with a note for work.

## 2021-04-02 NOTE — ED Provider Notes (Signed)
[facility]    CSN: [id number] Arrival date & time: 04/02/21  1642    HISTORY   Chief Complaint  Patient presents with   Work Note   HPI [name] is a 55 y.o. female. Patient complains of a single episode of vomiting last night, believes she ate some bad food.  Patient states she needs a note for work.   Past Medical History:  Diagnosis Date   Anxiety    Depression    Hyperlipidemia    Hypertension    Subclinical hypothyroidism    Patient Active Problem List   Diagnosis Date Noted   Elevated TSH 08/12/2016   Hypertension    Hyperlipidemia    Anxiety    Depression    Past Surgical History:  Procedure Laterality Date   SHOULDER SURGERY     TUBAL LIGATION     OB History   No obstetric history on file.    Home Medications    Prior to Admission medications   Medication Sig Start Date End Date Taking? Authorizing Provider  amLODipine (NORVASC) 5 MG tablet Take 1 tablet (5 mg total) by mouth daily. 02/21/19   Mardella Layman, MD  etodolac (LODINE) 500 MG tablet Take 1 tablet (500 mg total) by mouth 2 (two) times daily between meals as needed. 06/18/20   Kathryne Hitch, MD  meloxicam (MOBIC) 15 MG tablet Take 1 tablet (15 mg total) by mouth daily. 02/17/20   Bast, Gloris Manchester A, NP  methylPREDNISolone (MEDROL) 4 MG tablet Medrol dose pack. Take as instructed 06/17/20   Kathryne Hitch, MD  nabumetone (RELAFEN) 750 MG tablet Take 1 tablet (750 mg total) by mouth 2 (two) times daily as needed. 06/17/20   Kathryne Hitch, MD  predniSONE (STERAPRED UNI-PAK 21 TAB) 10 MG (21) TBPK tablet Take by mouth daily. Take 6 tabs by mouth daily  for 2 days, then 5 tabs for 2 days, then 4 tabs for 2 days, then 3 tabs for 2 days, 2 tabs for 2 days, then 1 tab by mouth daily for 2 days 10/01/20   Lurline Idol, FNP  triamcinolone cream (KENALOG) 0.1 % Apply 1 application topically 2 (two) times daily. Apply to affected areas twice a day 10/01/20   Lurline Idol, FNP  linaclotide [facility]) 145 MCG CAPS capsule Take 145 mcg by mouth daily before breakfast.  10/31/18  [provider]   Family History Family History  Problem Relation Age of Onset   Healthy Mother    Heart disease Father    Social History Social History   Tobacco Use   Smoking status: Never   Smokeless tobacco: Never  Substance Use Topics   Alcohol use: No   Drug use: No   Allergies   Patient has no known allergies.  Review of Systems Review of Systems Pertinent findings noted in history of present illness.   Physical Exam Triage Vital Signs ED Triage Vitals  Enc Vitals Group     BP 03/05/21 0827 (!) 147/82     Pulse Rate 03/05/21 0827 72     Resp 03/05/21 0827 18     Temp 03/05/21 0827 98.3 F (36.8 C)     Temp Source 03/05/21 0827 Oral     SpO2 03/05/21 0827 98 %     Weight --      Height --      Head Circumference --      Peak Flow --      Pain Score  03/05/21 0826 5     Pain Loc --      Pain Edu? --      Excl. in GC? --   No data found.  Updated Vital Signs BP (!) 184/105 (BP Location: Left Arm)   Pulse 61   Temp 98.2 F (36.8 C) (Oral)   Resp 18   LMP 11/16/2010   SpO2 100%   Physical Exam Vitals and nursing note reviewed.  Constitutional:      General: She is not in acute distress.    Appearance: Normal appearance. She is not ill-appearing.  HENT:     Head: Normocephalic and atraumatic.  Eyes:     General: Lids are normal.        Right eye: No discharge.        Left eye: No discharge.     Extraocular Movements: Extraocular movements intact.     Conjunctiva/sclera: Conjunctivae normal.     Right eye: Right conjunctiva is not injected.     Left eye: Left conjunctiva is not injected.  Neck:     Trachea: Trachea and phonation normal.  Cardiovascular:     Rate and Rhythm: Normal rate and regular rhythm.     Pulses: Normal pulses.     Heart sounds: Normal heart sounds. No murmur heard.   No friction rub. No gallop.   Pulmonary:     Effort: Pulmonary effort is normal. No accessory muscle usage, prolonged expiration or respiratory distress.     Breath sounds: Normal breath sounds. No stridor, decreased air movement or transmitted upper airway sounds. No decreased breath sounds, wheezing, rhonchi or rales.  Chest:     Chest wall: No tenderness.  Abdominal:     General: Abdomen is flat. Bowel sounds are normal. There is no distension.     Palpations: Abdomen is soft.     Tenderness: There is no abdominal tenderness. There is no right CVA tenderness or left CVA tenderness.     Hernia: No hernia is present.  Musculoskeletal:        General: Normal range of motion.     Cervical back: Normal range of motion and neck supple. Normal range of motion.  Lymphadenopathy:     Cervical: No cervical adenopathy.  Skin:    General: Skin is warm and dry.     Findings: No erythema or rash.  Neurological:     General: No focal deficit present.     Mental Status: She is alert and oriented to person, place, and time.  Psychiatric:        Mood and Affect: Mood normal.        Behavior: Behavior normal.    Visual Acuity Right Eye Distance:   Left Eye Distance:   Bilateral Distance:    Right Eye Near:   Left Eye Near:    Bilateral Near:     UC Couse / Diagnostics / Procedures:    EKG  Radiology No results found.  Procedures Procedures (including critical care time)  UC Diagnoses / Final Clinical Impressions(s)    Final diagnoses:  Vomiting without nausea, unspecified vomiting type   I have reviewed the triage vital signs and the nursing notes.  Pertinent labs & imaging results that were available during my care of the patient were reviewed by me and considered in my medical decision making (see chart for details).    Patient reports feeling well at this time, states she does not require any medications or treatment.  Patient states she  would simply like a note for work.  Note  provided.  Patient/parent/caregiver verbalized understanding and agreement of plan as discussed.  All questions were addressed during visit.  Please see discharge instructions below for further details of plan.  ED Prescriptions   None    PDMP not reviewed this encounter.  Pending results:  Labs Reviewed - No data to display   Medications Ordered in UC: Medications - No data to display  Discharge Instructions:   Discharge Instructions      I am sorry you are feeling unwell and I certainly hope you feel better.  I am happy to provide you with a note for work.      Disposition Upon Discharge:  Patient presented with an acute illness with associated systemic symptoms and significant discomfort requiring urgent management. In my opinion, this is a condition that a prudent lay person (someone who possesses an average knowledge of health and medicine) may potentially expect to result in complications if not addressed urgently such as respiratory distress, impairment of bodily function or dysfunction of bodily organs.   Routine symptom specific, illness specific and/or disease specific instructions were discussed with the patient and/or caregiver at length.   As such, the patient has been evaluated and assessed, work-up was performed and treatment was provided in alignment with urgent care protocols and evidence based medicine.  Patient/parent/caregiver has been advised that the patient may require follow up for further testing and treatment if the symptoms continue in spite of treatment, as clinically indicated and appropriate.  If the patient was tested for COVID-19, Influenza and/or RSV, then the patient/parent/guardian was advised to isolate at home pending the results of his/her diagnostic coronavirus test and potentially longer if they're positive. I have also advised pt that if his/her COVID-19 test returns positive, it's recommended to self-isolate for at least 10 days after  symptoms first appeared AND until fever-free for 24 hours without fever reducer AND other symptoms have improved or resolved. Discussed self-isolation recommendations as well as instructions for household member/close contacts as per the [facility] and [place] [facility], and also gave patient the COVID packet with this information.  Patient/parent/caregiver has been advised to return to the [facility] or PCP in 3-5 days if no better; to PCP or the Emergency Department if new signs and symptoms develop, or if the current signs or symptoms continue to change or worsen for further workup, evaluation and treatment as clinically indicated and appropriate  The patient will follow up with their current PCP if and as advised. If the patient does not currently have a PCP we will assist them in obtaining one.   The patient may need specialty follow up if the symptoms continue, in spite of conservative treatment and management, for further workup, evaluation, consultation and treatment as clinically indicated and appropriate.  Condition: stable for discharge home Home: take medications as prescribed; routine discharge instructions as discussed; follow up as advised.    Theadora Rama Scales, PA-C 04/02/21 1905

## 2021-04-02 NOTE — ED Triage Notes (Signed)
Pt presents for work note after having episode of vomiting last night that she believes is from some food she ate.

## 2021-04-25 ENCOUNTER — Emergency Department (HOSPITAL_COMMUNITY)
Admission: EM | Admit: 2021-04-25 | Discharge: 2021-04-25 | Disposition: A | Discharge status: Home / Self Care | Payer: Medicaid Other | Attending: Emergency Medicine | Admitting: Emergency Medicine

## 2021-04-25 ENCOUNTER — Encounter (HOSPITAL_COMMUNITY): Payer: Self-pay | Admitting: Emergency Medicine

## 2021-04-25 ENCOUNTER — Other Ambulatory Visit: Payer: Self-pay

## 2021-04-25 DIAGNOSIS — Z7689 Persons encountering health services in other specified circumstances: Secondary | ICD-10-CM

## 2021-04-25 DIAGNOSIS — Z0279 Encounter for issue of other medical certificate: Secondary | ICD-10-CM | POA: Insufficient documentation

## 2021-04-25 DIAGNOSIS — Z79899 Other long term (current) drug therapy: Secondary | ICD-10-CM | POA: Diagnosis not present

## 2021-04-25 DIAGNOSIS — I10 Essential (primary) hypertension: Secondary | ICD-10-CM | POA: Diagnosis not present

## 2021-04-25 NOTE — ED Triage Notes (Signed)
Pt states she is here for a work note.  States she was sick on Thursday and Friday with fever and not feeling well.  Took negative home COVID test.  States she feels fine now and just needs a work note.

## 2021-04-25 NOTE — ED Provider Notes (Signed)
[facility] EMERGENCY DEPARTMENT Provider Note   CSN: [id number] Arrival date & time: 04/25/21  1819     History Chief Complaint  Patient presents with   Letter for School/Work    [name] is a 55 y.o. female.  Pt reports she missed work on Thursday and Friday and her employer requires a note for her to return.  Pt denies any symptoms  no fever no chills no cough   The history is provided by the patient. No language interpreter was used.      Past Medical History:  Diagnosis Date   Anxiety    Depression    Hyperlipidemia    Hypertension    Subclinical hypothyroidism     Patient Active Problem List   Diagnosis Date Noted   Elevated TSH 08/12/2016   Hypertension    Hyperlipidemia    Anxiety    Depression     Past Surgical History:  Procedure Laterality Date   SHOULDER SURGERY     TUBAL LIGATION       OB History   No obstetric history on file.     Family History  Problem Relation Age of Onset   Healthy Mother    Heart disease Father     Social History   Tobacco Use   Smoking status: Never   Smokeless tobacco: Never  Substance Use Topics   Alcohol use: No   Drug use: No    Home Medications Prior to Admission medications   Medication Sig Start Date End Date Taking? Authorizing Provider  amLODipine (NORVASC) 5 MG tablet Take 1 tablet (5 mg total) by mouth daily. 02/21/19   Mardella Layman, MD  etodolac (LODINE) 500 MG tablet Take 1 tablet (500 mg total) by mouth 2 (two) times daily between meals as needed. 06/18/20   Kathryne Hitch, MD  meloxicam (MOBIC) 15 MG tablet Take 1 tablet (15 mg total) by mouth daily. 02/17/20   Bast, Gloris Manchester A, NP  methylPREDNISolone (MEDROL) 4 MG tablet Medrol dose pack. Take as instructed 06/17/20   Kathryne Hitch, MD  nabumetone (RELAFEN) 750 MG tablet Take 1 tablet (750 mg total) by mouth 2 (two) times daily as needed. 06/17/20   Kathryne Hitch, MD  predniSONE (STERAPRED  UNI-PAK 21 TAB) 10 MG (21) TBPK tablet Take by mouth daily. Take 6 tabs by mouth daily  for 2 days, then 5 tabs for 2 days, then 4 tabs for 2 days, then 3 tabs for 2 days, 2 tabs for 2 days, then 1 tab by mouth daily for 2 days 10/01/20   Lurline Idol, FNP  triamcinolone cream (KENALOG) 0.1 % Apply 1 application topically 2 (two) times daily. Apply to affected areas twice a day 10/01/20   Lurline Idol, FNP  linaclotide [facility]) 145 MCG CAPS capsule Take 145 mcg by mouth daily before breakfast.  10/31/18  [provider]    Allergies    Patient has no known allergies.  Review of Systems   Review of Systems  All other systems reviewed and are negative.  Physical Exam Updated Vital Signs BP (!) 161/108 (BP Location: Left Arm)    Pulse 90    Temp 99 F (37.2 C) (Oral)    Resp 17    LMP 11/16/2010    SpO2 98%   Physical Exam Vitals reviewed.  Constitutional:      Appearance: Normal appearance.  Cardiovascular:     Rate and Rhythm: Normal rate.  Pulses: Normal pulses.  Pulmonary:     Effort: Pulmonary effort is normal.  Neurological:     General: No focal deficit present.     Mental Status: She is alert.  Psychiatric:        Mood and Affect: Mood normal.    ED Results / Procedures / Treatments   Labs (all labs ordered are listed, but only abnormal results are displayed) Labs Reviewed - No data to display  EKG None  Radiology No results found.  Procedures Procedures   Medications Ordered in ED Medications - No data to display  ED Course  I have reviewed the triage vital signs and the nursing notes.  Pertinent labs & imaging results that were available during my care of the patient were reviewed by me and considered in my medical decision making (see chart for details).    MDM Rules/Calculators/A&P                             Final Clinical Impression(s) / ED Diagnoses Final diagnoses:  Return to work evaluation    Rx / DC Orders ED  Discharge Orders     None        Elson Areas, [place] 04/25/21 1933    Vanetta Mulders, MD 04/28/21 1334

## 2021-09-08 ENCOUNTER — Ambulatory Visit (HOSPITAL_COMMUNITY)
Admission: EM | Admit: 2021-09-08 | Discharge: 2021-09-08 | Disposition: A | Discharge status: Home / Self Care | Payer: Medicaid Other | Attending: Internal Medicine | Admitting: Internal Medicine

## 2021-09-08 ENCOUNTER — Other Ambulatory Visit: Payer: Self-pay

## 2021-09-08 ENCOUNTER — Encounter (HOSPITAL_COMMUNITY): Payer: Self-pay | Admitting: Emergency Medicine

## 2021-09-08 DIAGNOSIS — J069 Acute upper respiratory infection, unspecified: Secondary | ICD-10-CM

## 2021-09-08 DIAGNOSIS — R112 Nausea with vomiting, unspecified: Secondary | ICD-10-CM

## 2021-09-08 DIAGNOSIS — J0100 Acute maxillary sinusitis, unspecified: Secondary | ICD-10-CM

## 2021-09-08 MED ORDER — ONDANSETRON 4 MG PO TBDP: 4.0000 mg | ORAL_TABLET | Freq: Three times a day (TID) | ORAL | 0 refills | Status: DC | PRN

## 2021-09-08 MED ORDER — BENZONATATE 100 MG PO CAPS: 100.0000 mg | ORAL_CAPSULE | Freq: Three times a day (TID) | ORAL | 0 refills | Status: DC

## 2021-09-08 MED ORDER — AMOXICILLIN-POT CLAVULANATE 875-125 MG PO TABS: 1.0000 | ORAL_TABLET | Freq: Two times a day (BID) | ORAL | 0 refills | Status: DC

## 2021-09-08 MED ORDER — FLUTICASONE PROPIONATE 50 MCG/ACT NA SUSP: 1.0000 | Freq: Every day | NASAL | 2 refills | Status: AC

## 2021-09-08 NOTE — ED Triage Notes (Signed)
Cough and chest congestion for 2 weeks.  2 days ago a family member had a stomach virus.  2 nights ago, patient started vomiting.  That stopped, but now it is diarrhea-"just like water" ?

## 2021-09-08 NOTE — ED Provider Notes (Signed)
[facility]    CSN: [id number] Arrival date & time: 09/08/21  1641      History   Chief Complaint Chief Complaint  Patient presents with   Cough    HPI [name] is a 56 y.o. female.   Patient presents to urgent care for evaluation of her productive cough for 2 weeks. She had nasal congestion when the cough first started 2 weeks ago and this has gotten somewhat better. She has been taking dayquil and nyquil and that has helped her symptoms. She has been taking melatonin for sleep and hasn't been sleeping well because of her cough. She woke up this morning and lost her voice. She states that this happens every year around this time and eventually her voice comes back. 2 days ago, she developed nausea, vomiting, and diarrhea. She had many episodes of emesis last night through the night until 4am this morning. Since she last vomited, she has been able to keep down chicken noodle soup and water.  Reports 5-6 episodes of watery diarrhea today. Denies fever, abdominal pain, dizziness, and headache at home. Her step dad, daughter, and granddaughter have all been sick and she was exposed to all of them. She denies nausea at time of exam. Denies any other aggravating or relieving factors at this time.    Cough  Past Medical History:  Diagnosis Date   Anxiety    Depression    Hyperlipidemia    Hypertension    Subclinical hypothyroidism     Patient Active Problem List   Diagnosis Date Noted   Elevated TSH 08/12/2016   Hypertension    Hyperlipidemia    Anxiety    Depression     Past Surgical History:  Procedure Laterality Date   SHOULDER SURGERY     TUBAL LIGATION      OB History   No obstetric history on file.      Home Medications    Prior to Admission medications   Medication Sig Start Date End Date Taking? Authorizing Provider  amoxicillin-clavulanate (AUGMENTIN) 875-125 MG tablet Take 1 tablet by mouth every 12 (twelve) hours. 09/08/21  Yes Carlisle Beers, FNP  benzonatate (TESSALON) 100 MG capsule Take 1 capsule (100 mg total) by mouth every 8 (eight) hours. 09/08/21  Yes Carlisle Beers, FNP  fluticasone (FLONASE) 50 MCG/ACT nasal spray Place 1 spray into both nostrils daily. 09/08/21  Yes Carlisle Beers, FNP  ondansetron (ZOFRAN-ODT) 4 MG disintegrating tablet Take 1 tablet (4 mg total) by mouth every 8 (eight) hours as needed for nausea or vomiting. 09/08/21  Yes Carlisle Beers, FNP  amLODipine (NORVASC) 5 MG tablet Take 1 tablet (5 mg total) by mouth daily. 02/21/19   Mardella Layman, MD  etodolac (LODINE) 500 MG tablet Take 1 tablet (500 mg total) by mouth 2 (two) times daily between meals as needed. Patient not taking: Reported on 09/08/2021 06/18/20   Kathryne Hitch, MD  meloxicam (MOBIC) 15 MG tablet Take 1 tablet (15 mg total) by mouth daily. Patient not taking: Reported on 09/08/2021 02/17/20   Dahlia Byes A, NP  methylPREDNISolone (MEDROL) 4 MG tablet Medrol dose pack. Take as instructed Patient not taking: Reported on 09/08/2021 06/17/20   Kathryne Hitch, MD  nabumetone (RELAFEN) 750 MG tablet Take 1 tablet (750 mg total) by mouth 2 (two) times daily as needed. Patient not taking: Reported on 09/08/2021 06/17/20   Kathryne Hitch, MD  predniSONE (STERAPRED UNI-PAK 21 TAB) 10  MG (21) TBPK tablet Take by mouth daily. Take 6 tabs by mouth daily  for 2 days, then 5 tabs for 2 days, then 4 tabs for 2 days, then 3 tabs for 2 days, 2 tabs for 2 days, then 1 tab by mouth daily for 2 days Patient not taking: Reported on 09/08/2021 10/01/20   Lurline Idol, FNP  triamcinolone cream (KENALOG) 0.1 % Apply 1 application topically 2 (two) times daily. Apply to affected areas twice a day Patient not taking: Reported on 09/08/2021 10/01/20   Lurline Idol, FNP  linaclotide Karlene Einstein) 145 MCG CAPS capsule Take 145 mcg by mouth daily before breakfast.  10/31/18  [provider]    Family History Family  History  Problem Relation Age of Onset   Healthy Mother    Heart disease Father     Social History Social History   Tobacco Use   Smoking status: Never   Smokeless tobacco: Never  Vaping Use   Vaping Use: Never used  Substance Use Topics   Alcohol use: No   Drug use: No     Allergies   Patient has no known allergies.   Review of Systems Review of Systems  Respiratory:  Positive for cough.   Per HPI  Physical Exam Triage Vital Signs ED Triage Vitals  Enc Vitals Group     BP 09/08/21 1743 (!) 175/96     Pulse Rate 09/08/21 1743 71     Resp 09/08/21 1743 18     Temp 09/08/21 1743 98.3 F (36.8 C)     Temp Source 09/08/21 1743 Oral     SpO2 09/08/21 1743 95 %     Weight --      Height --      Head Circumference --      Peak Flow --      Pain Score 09/08/21 1740 5     Pain Loc --      Pain Edu? --      Excl. in GC? --    No data found.  Updated Vital Signs BP (!) 175/96 (BP Location: Left Arm)   Pulse 71   Temp 98.3 F (36.8 C) (Oral)   Resp 18   LMP 11/16/2010   SpO2 95%   Visual Acuity Right Eye Distance:   Left Eye Distance:   Bilateral Distance:    Right Eye Near:   Left Eye Near:    Bilateral Near:     Physical Exam Vitals and nursing note reviewed.  Constitutional:      General: She is not in acute distress.    Appearance: Normal appearance. She is well-developed. She is not ill-appearing or toxic-appearing.     Comments: Very pleasant patient sitting comfortably on exam table in no acute distress.  HENT:     Head: Normocephalic and atraumatic.     Jaw: There is normal jaw occlusion.     Right Ear: Hearing, tympanic membrane, ear canal and external ear normal.     Left Ear: Hearing, tympanic membrane, ear canal and external ear normal.     Nose: Congestion present.     Right Turbinates: Swollen.     Left Turbinates: Swollen.     Right Sinus: Maxillary sinus tenderness present.     Left Sinus: Maxillary sinus tenderness present.      Mouth/Throat:     Lips: Pink.     Mouth: Mucous membranes are moist.     Tongue: No lesions. Tongue does not deviate from  midline.     Palate: No mass and lesions.     Pharynx: Uvula midline. Posterior oropharyngeal erythema present.     Tonsils: No tonsillar exudate or tonsillar abscesses. 0 on the right. 0 on the left.     Comments: Very mild erythema to posterior oropharynx and yellow/white postnasal drainage present. Eyes:     General: Lids are normal. Gaze aligned appropriately. No allergic shiner.    Extraocular Movements: Extraocular movements intact.     Conjunctiva/sclera: Conjunctivae normal.  Cardiovascular:     Rate and Rhythm: Normal rate and regular rhythm.     Heart sounds: Normal heart sounds, S1 normal and S2 normal. No murmur heard.   No friction rub. No gallop.  Pulmonary:     Effort: Pulmonary effort is normal. No respiratory distress.     Breath sounds: Normal breath sounds and air entry. No decreased breath sounds, wheezing, rhonchi or rales.     Comments: Lung sounds clear to auscultation in all fields.  Benign pulmonary exam.  Airway patent. Chest:     Chest wall: No tenderness.  Abdominal:     Palpations: Abdomen is soft.     Tenderness: There is no abdominal tenderness. There is no right CVA tenderness or left CVA tenderness.  Musculoskeletal:        General: No swelling.     Cervical back: Normal range of motion and neck supple.     Right lower leg: No edema.     Left lower leg: No edema.  Lymphadenopathy:     Cervical: No cervical adenopathy.  Skin:    General: Skin is warm and dry.     Capillary Refill: Capillary refill takes less than 2 seconds.     Findings: No rash.     Comments: Skin turgor normal.  No systemic signs of dehydration to physical exam at this time.  Neurological:     General: No focal deficit present.     Mental Status: She is alert and oriented to person, place, and time.  Psychiatric:        Mood and Affect: Mood normal.         Behavior: Behavior normal. Behavior is cooperative.        Thought Content: Thought content normal.        Judgment: Judgment normal.     UC Treatments / Results  Labs (all labs ordered are listed, but only abnormal results are displayed) Labs Reviewed - No data to display  EKG   Radiology No results found.  Procedures Procedures (including critical care time)  Medications Ordered in UC Medications - No data to display  Initial Impression / Assessment and Plan / UC Course  I have reviewed the triage vital signs and the nursing notes.  Pertinent labs & imaging results that were available during my care of the patient were reviewed by me and considered in my medical decision making (see chart for details).  Patient is a 56 year old female presenting to urgent care for productive cough for the last 2 weeks with positive sick contacts.  Physical exam reveals very swollen nasal turbinates with nasal congestion.  She is afebrile.  Doubt any acute intrathoracic pulmonary process at this time.  Deferred imaging due to stable cardiopulmonary exam and vital signs.  Suspect she has developed a postviral bacterial sinus infection to the maxillary sinuses due to length/timing of illness, tenderness with palpation of sinuses, and persistent nasal congestion and cough.  Plan to treat cough with Jerilynn Som  and prescribed 7-day course of Augmentin for postviral bacterial sinus infection.  Also prescribed Flonase to be used for nasal congestion and swelling 1 puff into each nostril daily  She is also likely recovering from an acute self-limiting gastroenteritis.  I suspect that she is towards the end of this gastroenteritis due to diarrhea complaint today.  She does not show any signs of systemic dehydration at this time and is stable to go home with by mouth rehydration instructions.  Abdominal exam benign and no peritoneal signs present to physical exam.  Plan to prescribe medications for  supportive care at home.  Zofran prescription given for home use for nausea and vomiting.  Patient encouraged to drink plenty of fluids and follow the brat diet for the next 12 to 24 hours.  After this, she may increase her diet to normal as tolerated.   Counseled patient regarding appropriate use of medications and potential side effects for all medications recommended or prescribed today.  Instructed to purchase over-the-counter probiotic or eat yogurt with live cultures if she develops any diarrhea when taking Augmentin antibiotic for 7 days.  She denies allergies to medications.  Discussed red flag signs and symptoms of worsening condition,when to call the PCP office, return to urgent care, and when to seek higher level of care. Patient verbalizes understanding and agreement with plan. All questions answered. Patient discharged in stable condition.    Final Clinical Impressions(s) / UC Diagnoses   Final diagnoses:  Nausea and vomiting, unspecified vomiting type  Acute non-recurrent maxillary sinusitis     Discharge Instructions      You have a postviral sinus infection.  I have prescribed Augmentin for you to take twice daily for 7 days.  Please take the entire course of this antibiotic even if you begin to feel better before you are done taking it.  I have also prescribed Tessalon Perles for your cough.  You can take these as needed to help with your cough.  These will not make you sleepy, so you can take them during the day if you need to. Flonase was sent to the pharmacy for you for your nasal congestion and swelling. If you become nauseous again, I have sent in Zofran to the pharmacy for you to take as needed for nausea and vomiting.  I believe that you are at the end of your viral gastroenteritis course, but if you do become nauseous again, please take the Zofran so that you are able to keep water down and prevent dehydration.  Please follow the brat diet tomorrow and eat bananas,  rice, applesauce, and toast and other foods that are easy on your stomach.  You may increase your diet as tolerated once you are feeling better.  Please drink plenty of water to stay hydrated.  If you develop any new or worsening symptoms or do not improve in the next 2 to 3 days, please return.  If your symptoms are severe, please go to the emergency room.  Follow-up with your primary care provider for further evaluation and management of your symptoms as well as ongoing wellness visits.  I hope you feel better!       ED Prescriptions     Medication Sig Dispense Auth. Provider   amoxicillin-clavulanate (AUGMENTIN) 875-125 MG tablet Take 1 tablet by mouth every 12 (twelve) hours. 14 tablet Reita May M, FNP   benzonatate (TESSALON) 100 MG capsule Take 1 capsule (100 mg total) by mouth every 8 (eight) hours. 21 capsule Kamran Coker,  Donavan Burnet, FNP   fluticasone (FLONASE) 50 MCG/ACT nasal spray Place 1 spray into both nostrils daily. 16 g Reita May M, FNP   ondansetron (ZOFRAN-ODT) 4 MG disintegrating tablet Take 1 tablet (4 mg total) by mouth every 8 (eight) hours as needed for nausea or vomiting. 20 tablet Carlisle Beers, FNP      PDMP not reviewed this encounter.   Carlisle Beers, [place] 09/10/21 2042

## 2021-09-08 NOTE — Discharge Instructions (Addendum)
You have a postviral sinus infection.  I have prescribed Augmentin for you to take twice daily for 7 days.  Please take the entire course of this antibiotic even if you begin to feel better before you are done taking it.  ?I have also prescribed Tessalon Perles for your cough.  You can take these as needed to help with your cough.  These will not make you sleepy, so you can take them during the day if you need to. ?Flonase was sent to the pharmacy for you for your nasal congestion and swelling. ?If you become nauseous again, I have sent in Zofran to the pharmacy for you to take as needed for nausea and vomiting.  I believe that you are at the end of your viral gastroenteritis course, but if you do become nauseous again, please take the Zofran so that you are able to keep water down and prevent dehydration. ? ?Please follow the brat diet tomorrow and eat bananas, rice, applesauce, and toast and other foods that are easy on your stomach.  You may increase your diet as tolerated once you are feeling better.  Please drink plenty of water to stay hydrated. ? ?If you develop any new or worsening symptoms or do not improve in the next 2 to 3 days, please return.  If your symptoms are severe, please go to the emergency room.  Follow-up with your primary care provider for further evaluation and management of your symptoms as well as ongoing wellness visits.  I hope you feel better! ? ? ?

## 2021-11-19 ENCOUNTER — Ambulatory Visit (HOSPITAL_COMMUNITY)
Admission: EM | Admit: 2021-11-19 | Discharge: 2021-11-19 | Disposition: A | Discharge status: Home / Self Care | Payer: Medicaid Other | Attending: Physician Assistant | Admitting: Physician Assistant

## 2021-11-19 ENCOUNTER — Encounter (HOSPITAL_COMMUNITY): Payer: Self-pay

## 2021-11-19 DIAGNOSIS — R197 Diarrhea, unspecified: Secondary | ICD-10-CM | POA: Diagnosis not present

## 2021-11-19 DIAGNOSIS — R112 Nausea with vomiting, unspecified: Secondary | ICD-10-CM

## 2021-11-19 MED ORDER — OMEPRAZOLE 20 MG PO CPDR: 20.0000 mg | DELAYED_RELEASE_CAPSULE | Freq: Every day | ORAL | 0 refills | Status: DC

## 2021-11-19 MED ORDER — ONDANSETRON HCL 4 MG PO TABS: 4.0000 mg | ORAL_TABLET | Freq: Four times a day (QID) | ORAL | 0 refills | Status: DC

## 2021-11-19 NOTE — ED Provider Notes (Addendum)
[facility]    CSN: [id number] Arrival date & time: 11/19/21  1616      History   Chief Complaint Chief Complaint  Patient presents with   Abdominal Pain    HPI [name] is a 56 y.o. female.   Pt complains of two days of nausea, vomiting, and reflux.  Reports she has a history of diverticulitis and is concerned it may be from that.  She reports experiencing epigastric discomfort and acid reflux.  She has taken nothing for the symptoms.  She reports she was initially feeling constipated.  She has started taking metamucil.  Reports episode of diarrhea today. Reports have improved today.  Keeping fluids down.     Past Medical History:  Diagnosis Date   Anxiety    Depression    Hyperlipidemia    Hypertension    Subclinical hypothyroidism     Patient Active Problem List   Diagnosis Date Noted   Elevated TSH 08/12/2016   Hypertension    Hyperlipidemia    Anxiety    Depression     Past Surgical History:  Procedure Laterality Date   SHOULDER SURGERY     TUBAL LIGATION      OB History   No obstetric history on file.      Home Medications    Prior to Admission medications   Medication Sig Start Date End Date Taking? Authorizing Provider  omeprazole (PRILOSEC) 20 MG capsule Take 1 capsule (20 mg total) by mouth daily. 11/19/21 12/19/21 Yes Ward, Lenise Arena, PA-C  ondansetron (ZOFRAN) 4 MG tablet Take 1 tablet (4 mg total) by mouth every 6 (six) hours. 11/19/21  Yes Ward, Lenise Arena, PA-C  amLODipine (NORVASC) 5 MG tablet Take 1 tablet (5 mg total) by mouth daily. 02/21/19   Vanessa Kick, MD  amoxicillin-clavulanate (AUGMENTIN) 875-125 MG tablet Take 1 tablet by mouth every 12 (twelve) hours. 09/08/21   Talbot Grumbling, FNP  etodolac (LODINE) 500 MG tablet Take 1 tablet (500 mg total) by mouth 2 (two) times daily between meals as needed. Patient not taking: Reported on 09/08/2021 06/18/20   Mcarthur Rossetti, MD  fluticasone [facility]) 50 MCG/ACT  nasal spray Place 1 spray into both nostrils daily. 09/08/21   Talbot Grumbling, FNP  meloxicam (MOBIC) 15 MG tablet Take 1 tablet (15 mg total) by mouth daily. Patient not taking: Reported on 09/08/2021 02/17/20   Loura Halt A, NP  methylPREDNISolone (MEDROL) 4 MG tablet Medrol dose pack. Take as instructed Patient not taking: Reported on 09/08/2021 06/17/20   Mcarthur Rossetti, MD  nabumetone (RELAFEN) 750 MG tablet Take 1 tablet (750 mg total) by mouth 2 (two) times daily as needed. Patient not taking: Reported on 09/08/2021 06/17/20   Mcarthur Rossetti, MD  ondansetron (ZOFRAN-ODT) 4 MG disintegrating tablet Take 1 tablet (4 mg total) by mouth every 8 (eight) hours as needed for nausea or vomiting. 09/08/21   Talbot Grumbling, FNP  predniSONE (STERAPRED UNI-PAK 21 TAB) 10 MG (21) TBPK tablet Take by mouth daily. Take 6 tabs by mouth daily  for 2 days, then 5 tabs for 2 days, then 4 tabs for 2 days, then 3 tabs for 2 days, 2 tabs for 2 days, then 1 tab by mouth daily for 2 days Patient not taking: Reported on 09/08/2021 10/01/20   Enrique Sack, FNP  triamcinolone cream (KENALOG) 0.1 % Apply 1 application topically 2 (two) times daily. Apply to affected areas twice a day Patient not  taking: Reported on 09/08/2021 10/01/20   Lurline Idol, FNP  linaclotide Karlene Einstein) 145 MCG CAPS capsule Take 145 mcg by mouth daily before breakfast.  10/31/18  [provider]    Family History Family History  Problem Relation Age of Onset   Healthy Mother    Heart disease Father     Social History Social History   Tobacco Use   Smoking status: Never   Smokeless tobacco: Never  Vaping Use   Vaping Use: Never used  Substance Use Topics   Alcohol use: No   Drug use: No     Allergies   Patient has no known allergies.   Review of Systems Review of Systems  Constitutional:  Negative for chills and fever.  HENT:  Negative for ear pain and sore throat.   Eyes:  Negative for  pain and visual disturbance.  Respiratory:  Negative for cough and shortness of breath.   Cardiovascular:  Negative for chest pain and palpitations.  Gastrointestinal:  Positive for abdominal pain, diarrhea, nausea and vomiting.  Genitourinary:  Negative for dysuria and hematuria.  Musculoskeletal:  Negative for arthralgias and back pain.  Skin:  Negative for color change and rash.  Neurological:  Negative for seizures and syncope.  All other systems reviewed and are negative.    Physical Exam Triage Vital Signs ED Triage Vitals  Enc Vitals Group     BP 11/19/21 1646 (!) 188/106     Pulse Rate 11/19/21 1646 85     Resp 11/19/21 1646 18     Temp 11/19/21 1646 (!) 97.5 F (36.4 C)     Temp Source 11/19/21 1646 Oral     SpO2 11/19/21 1646 95 %     Weight --      Height --      Head Circumference --      Peak Flow --      Pain Score 11/19/21 1647 5     Pain Loc --      Pain Edu? --      Excl. in GC? --    No data found.  Updated Vital Signs BP (!) 188/106 (BP Location: Left Arm)   Pulse 85   Temp (!) 97.5 F (36.4 C) (Oral)   Resp 18   LMP 11/16/2010   SpO2 95%   Visual Acuity Right Eye Distance:   Left Eye Distance:   Bilateral Distance:    Right Eye Near:   Left Eye Near:    Bilateral Near:     Physical Exam Vitals and nursing note reviewed.  Constitutional:      General: She is not in acute distress.    Appearance: She is well-developed.  HENT:     Head: Normocephalic and atraumatic.  Eyes:     Conjunctiva/sclera: Conjunctivae normal.  Cardiovascular:     Rate and Rhythm: Normal rate and regular rhythm.     Heart sounds: No murmur heard. Pulmonary:     Effort: Pulmonary effort is normal. No respiratory distress.     Breath sounds: Normal breath sounds.  Abdominal:     Palpations: Abdomen is soft.     Tenderness: There is no abdominal tenderness.     Comments: Mild epigastric discomfort  Musculoskeletal:        General: No swelling.     Cervical  back: Neck supple.  Skin:    General: Skin is warm and dry.     Capillary Refill: Capillary refill takes less than 2 seconds.  Neurological:  Mental Status: She is alert.  Psychiatric:        Mood and Affect: Mood normal.      UC Treatments / Results  Labs (all labs ordered are listed, but only abnormal results are displayed) Labs Reviewed - No data to display  EKG   Radiology No results found.  Procedures Procedures (including critical care time)  Medications Ordered in UC Medications - No data to display  Initial Impression / Assessment and Plan / UC Course  I have reviewed the triage vital signs and the nursing notes.  Pertinent labs & imaging results that were available during my care of the patient were reviewed by me and considered in my medical decision making (see chart for details).     Nausea, vomiting, diarrhea; somewhat improved.  She is experiencing acid reflux.  Overall well appearing, benign abdomen.  Stable for discharge with supportive care.  ED precautions given.  Pt request work note.   Advised to follow up with PCP regarding blood pressure.  She has taken her blood pressure medication today, reports she has had several elevated readings. Denies headache, visual changes, chest pain, palpitations, lower extremity edema.  ED precautions given.  Final Clinical Impressions(s) / UC Diagnoses   Final diagnoses:  Nausea vomiting and diarrhea     Discharge Instructions      Take omeprazole once daily Can take Zofran as needed Drink plenty of fluids Continue with metamucil Follow up with primary care regarding blood pressure.  If you develop worsening symptoms, severe abdominal pain, cannot keep fluids down, or fever go to the Emergency Department for evaluation.    ED Prescriptions     Medication Sig Dispense Auth. Provider   omeprazole (PRILOSEC) 20 MG capsule Take 1 capsule (20 mg total) by mouth daily. 30 capsule Ward, Shanda Bumps Z, PA-C    ondansetron (ZOFRAN) 4 MG tablet Take 1 tablet (4 mg total) by mouth every 6 (six) hours. 12 tablet Ward, Tylene Fantasia, PA-C      PDMP not reviewed this encounter.   Ward, Tylene Fantasia, PA-C 11/19/21 1709    Ward, Tylene Fantasia, PA-C 11/19/21 1710

## 2021-11-19 NOTE — ED Triage Notes (Signed)
Pt c/o lower abdominal pain with vomiting and some diarrhea x2 days. States hx of diverticulitis.

## 2021-11-19 NOTE — Discharge Instructions (Addendum)
Take omeprazole once daily Can take Zofran as needed Drink plenty of fluids Continue with metamucil Follow up with primary care regarding blood pressure.  If you develop worsening symptoms, severe abdominal pain, cannot keep fluids down, or fever go to the Emergency Department for evaluation.

## 2021-12-06 ENCOUNTER — Emergency Department (HOSPITAL_COMMUNITY)
Admission: EM | Admit: 2021-12-06 | Discharge: 2021-12-06 | Discharge status: Against Medical Advice | Payer: Medicaid Other | Attending: Emergency Medicine | Admitting: Emergency Medicine

## 2021-12-06 ENCOUNTER — Emergency Department (HOSPITAL_COMMUNITY): Payer: Medicaid Other

## 2021-12-06 ENCOUNTER — Encounter (HOSPITAL_COMMUNITY): Payer: Self-pay

## 2021-12-06 ENCOUNTER — Other Ambulatory Visit: Payer: Self-pay

## 2021-12-06 DIAGNOSIS — E039 Hypothyroidism, unspecified: Secondary | ICD-10-CM | POA: Diagnosis not present

## 2021-12-06 DIAGNOSIS — I10 Essential (primary) hypertension: Secondary | ICD-10-CM | POA: Diagnosis not present

## 2021-12-06 DIAGNOSIS — Z79899 Other long term (current) drug therapy: Secondary | ICD-10-CM | POA: Insufficient documentation

## 2021-12-06 DIAGNOSIS — R1013 Epigastric pain: Secondary | ICD-10-CM | POA: Insufficient documentation

## 2021-12-06 DIAGNOSIS — R079 Chest pain, unspecified: Secondary | ICD-10-CM | POA: Diagnosis present

## 2021-12-06 DIAGNOSIS — R7989 Other specified abnormal findings of blood chemistry: Secondary | ICD-10-CM | POA: Diagnosis not present

## 2021-12-06 LAB — COMPREHENSIVE METABOLIC PANEL
ALT: 20 U/L (ref 0–44)
AST: 25 U/L (ref 15–41)
Albumin: 3.9 g/dL (ref 3.5–5.0)
Alkaline Phosphatase: 41 U/L (ref 38–126)
Anion gap: 11 (ref 5–15)
BUN: 7 mg/dL (ref 6–20)
CO2: 23 mmol/L (ref 22–32)
Calcium: 10.6 mg/dL — ABNORMAL HIGH (ref 8.9–10.3)
Chloride: 104 mmol/L (ref 98–111)
Creatinine, Ser: 0.8 mg/dL (ref 0.44–1.00)
GFR, Estimated: >60 mL/min (ref >60)
Glucose, Bld: 104 mg/dL — ABNORMAL HIGH (ref 70–99)
Potassium: 3.4 mmol/L — ABNORMAL LOW (ref 3.5–5.1)
Sodium: 138 mmol/L (ref 135–145)
Total Bilirubin: 1.7 mg/dL — ABNORMAL HIGH (ref 0.3–1.2)
Total Protein: 7.1 g/dL (ref 6.5–8.1)

## 2021-12-06 LAB — CBC WITH DIFFERENTIAL/PLATELET
Abs Immature Granulocytes: 0.03 10*3/uL (ref 0.00–0.07)
Basophils Absolute: 0.1 10*3/uL (ref 0.0–0.1)
Basophils Relative: 1 %
Eosinophils Absolute: 0.2 10*3/uL (ref 0.0–0.5)
Eosinophils Relative: 2 %
HCT: 42.9 % (ref 36.0–46.0)
Hemoglobin: 15 g/dL (ref 12.0–15.0)
Immature Granulocytes: 0 %
Lymphocytes Relative: 44 %
Lymphs Abs: 4.2 10*3/uL — ABNORMAL HIGH (ref 0.7–4.0)
MCH: 31.6 pg (ref 26.0–34.0)
MCHC: 35 g/dL (ref 30.0–36.0)
MCV: 90.3 fL (ref 80.0–100.0)
Monocytes Absolute: 0.7 10*3/uL (ref 0.1–1.0)
Monocytes Relative: 8 %
Neutro Abs: 4.3 10*3/uL (ref 1.7–7.7)
Neutrophils Relative %: 45 %
Platelets: 378 10*3/uL (ref 150–400)
RBC: 4.75 MIL/uL (ref 3.87–5.11)
RDW: 11.8 % (ref 11.5–15.5)
WBC: 9.5 10*3/uL (ref 4.0–10.5)
nRBC: 0 % (ref 0.0–0.2)

## 2021-12-06 LAB — TROPONIN I (HIGH SENSITIVITY)
Troponin I (High Sensitivity): 6 ng/L (ref <18)
Troponin I (High Sensitivity): 6 ng/L (ref <18)

## 2021-12-06 LAB — LIPASE, BLOOD: Lipase: 28 U/L (ref 11–51)

## 2021-12-06 MED ORDER — LIDOCAINE VISCOUS HCL 2 % MT SOLN
15.0000 mL | Freq: Once | OROMUCOSAL | Status: AC
  Administered 2021-12-06: 15 mL via ORAL
  Filled 2021-12-06: qty 15

## 2021-12-06 MED ORDER — ALUM & MAG HYDROXIDE-SIMETH 200-200-20 MG/5ML PO SUSP
30.0000 mL | Freq: Once | ORAL | Status: AC
  Administered 2021-12-06: 30 mL via ORAL
  Filled 2021-12-06: qty 30

## 2021-12-06 NOTE — ED Triage Notes (Signed)
Pt arrived POV from home c/o centralized CP that started 30 mins prior to arrival. Pt states it radiates into her back and endorses some SHOB.

## 2021-12-06 NOTE — ED Provider Triage Note (Signed)
Emergency Medicine Provider Triage Evaluation Note  [name] , a 56 y.o. female  was evaluated in triage.  Pt complains of chest pain that began shortly PTA. Central chest/epigastrium, aching, worse with swallowing, no alleviating factors. Thought it was indigestion at first. Denies vomiting, dyspnea, fever, numbness, or weakness   Review of Systems  Per above  Physical Exam  BP (!) 175/105 (BP Location: Left Arm)   Pulse 86   Temp 98 F (36.7 C) (Oral)   Resp 20   Ht 5\' 3"  (1.6 m)   Wt 61.2 kg   LMP 11/16/2010   SpO2 99%   BMI 23.91 kg/m  Gen:   Awake, no distress   Resp:  Normal effort  MSK:   Moves extremities without difficulty  Other:  Epigastric abdominal TTP. 2+ symmetric radial pulses.   Medical Decision Making  Medically screening exam initiated at 5:57 AM.  Appropriate orders placed.  [name] was informed that the remainder of the evaluation will be completed by another provider, this initial triage assessment does not replace that evaluation, and the importance of remaining in the ED until their evaluation is complete.  Chest pain.    Harvest Dark, Cherly Anderson 12/06/21 0559

## 2021-12-06 NOTE — ED Notes (Signed)
No answer for labs

## 2021-12-06 NOTE — ED Provider Notes (Signed)
[facility] EMERGENCY DEPARTMENT Provider Note   CSN: [id number] Arrival date & time: 12/06/21  0544     History  Chief Complaint  Patient presents with   Chest Pain    [name] is a 56 y.o. female with a history of hypertension, hyperlipidemia, anxiety, depression, subclinical hypothyroidism.  Presents the emergency department complaint of chest pain.  Patient reports that chest pain started approximately 4 to 5 AM this morning while laying in bed.  Pain was located to inferior aspect of sternum/epigastric area.  Patient described pain as a "tightness."  Patient reports that tightness lasted for approximately 1 hour and then resolved.  Patient does complain of burning sensation in epigastric area at this time.  Patient denies any associated nausea, vomiting, diaphoresis, palpitations, lightheadedness, syncope.  Patient denies any tobacco use.  Patient does endorse family history of CAD less than 48.  Patient denies any frequent NSAID use, illicit drug use, abdominal surgery.  Patient does endorse occasional alcohol use.     Chest Pain Associated symptoms: abdominal pain   Associated symptoms: no back pain, no dizziness, no fever, no headache, no nausea, no palpitations, no shortness of breath and no vomiting        Home Medications Prior to Admission medications   Medication Sig Start Date End Date Taking? Authorizing Provider  amLODipine (NORVASC) 5 MG tablet Take 1 tablet (5 mg total) by mouth daily. 02/21/19   Vanessa Kick, MD  amoxicillin-clavulanate (AUGMENTIN) 875-125 MG tablet Take 1 tablet by mouth every 12 (twelve) hours. 09/08/21   Talbot Grumbling, FNP  etodolac (LODINE) 500 MG tablet Take 1 tablet (500 mg total) by mouth 2 (two) times daily between meals as needed. Patient not taking: Reported on 09/08/2021 06/18/20   Mcarthur Rossetti, MD  fluticasone [facility]) 50 MCG/ACT nasal spray Place 1 spray into both nostrils daily. 09/08/21    Talbot Grumbling, FNP  meloxicam (MOBIC) 15 MG tablet Take 1 tablet (15 mg total) by mouth daily. Patient not taking: Reported on 09/08/2021 02/17/20   Loura Halt A, NP  methylPREDNISolone (MEDROL) 4 MG tablet Medrol dose pack. Take as instructed Patient not taking: Reported on 09/08/2021 06/17/20   Mcarthur Rossetti, MD  nabumetone (RELAFEN) 750 MG tablet Take 1 tablet (750 mg total) by mouth 2 (two) times daily as needed. Patient not taking: Reported on 09/08/2021 06/17/20   Mcarthur Rossetti, MD  omeprazole (PRILOSEC) 20 MG capsule Take 1 capsule (20 mg total) by mouth daily. 11/19/21 12/19/21  Ward, Lenise Arena, PA-C  ondansetron (ZOFRAN) 4 MG tablet Take 1 tablet (4 mg total) by mouth every 6 (six) hours. 11/19/21   Ward, Lenise Arena, PA-C  ondansetron (ZOFRAN-ODT) 4 MG disintegrating tablet Take 1 tablet (4 mg total) by mouth every 8 (eight) hours as needed for nausea or vomiting. 09/08/21   Talbot Grumbling, FNP  predniSONE (STERAPRED UNI-PAK 21 TAB) 10 MG (21) TBPK tablet Take by mouth daily. Take 6 tabs by mouth daily  for 2 days, then 5 tabs for 2 days, then 4 tabs for 2 days, then 3 tabs for 2 days, 2 tabs for 2 days, then 1 tab by mouth daily for 2 days Patient not taking: Reported on 09/08/2021 10/01/20   Enrique Sack, FNP  triamcinolone cream (KENALOG) 0.1 % Apply 1 application topically 2 (two) times daily. Apply to affected areas twice a day Patient not taking: Reported on 09/08/2021 10/01/20   Enrique Sack, FNP  linaclotide (  LINZESS) 145 MCG CAPS capsule Take 145 mcg by mouth daily before breakfast.  10/31/18  [provider]      Allergies    Patient has no known allergies.    Review of Systems   Review of Systems  Constitutional:  Negative for chills and fever.  Eyes:  Negative for visual disturbance.  Respiratory:  Negative for shortness of breath.   Cardiovascular:  Positive for chest pain. Negative for palpitations and leg swelling.  Gastrointestinal:   Positive for abdominal pain. Negative for abdominal distention, anal bleeding, blood in stool, constipation, diarrhea, nausea, rectal pain and vomiting.  Genitourinary:  Negative for difficulty urinating, dysuria, flank pain, frequency, hematuria and urgency.  Musculoskeletal:  Negative for back pain and neck pain.  Skin:  Negative for color change and rash.  Neurological:  Negative for dizziness, syncope, light-headedness and headaches.  Psychiatric/Behavioral:  Negative for confusion.     Physical Exam Updated Vital Signs BP (!) 156/110 (BP Location: Right Arm)   Pulse 92   Temp 97.7 F (36.5 C) (Oral)   Resp 16   Ht 5' 3"  (1.6 m)   Wt 61.2 kg   LMP 11/16/2010   SpO2 98%   BMI 23.91 kg/m  Physical Exam Vitals and nursing note reviewed.  Constitutional:      General: She is not in acute distress.    Appearance: She is not ill-appearing, toxic-appearing or diaphoretic.  HENT:     Head: Normocephalic.  Eyes:     General: No scleral icterus.       Right eye: No discharge.        Left eye: No discharge.  Cardiovascular:     Rate and Rhythm: Normal rate.     Pulses:          Radial pulses are 2+ on the right side and 2+ on the left side.     Heart sounds: Normal heart sounds, S1 normal and S2 normal. Heart sounds not distant. No murmur heard. Pulmonary:     Effort: Pulmonary effort is normal. No tachypnea, bradypnea or respiratory distress.     Breath sounds: Normal breath sounds. No stridor.  Abdominal:     General: Abdomen is flat. Bowel sounds are normal. There is no distension. There are no signs of injury.     Palpations: Abdomen is soft. There is no mass or pulsatile mass.     Tenderness: There is abdominal tenderness in the epigastric area. There is no right CVA tenderness, left CVA tenderness, guarding or rebound. Negative signs include Murphy's sign.     Hernia: There is no hernia in the umbilical area or ventral area.     Comments: Tenderness to epigastric area   Musculoskeletal:     Right lower leg: No swelling, deformity, lacerations, tenderness or bony tenderness. No edema.     Left lower leg: No swelling, deformity, lacerations, tenderness or bony tenderness. No edema.  Skin:    General: Skin is warm and dry.  Neurological:     General: No focal deficit present.     Mental Status: She is alert.  Psychiatric:        Behavior: Behavior is cooperative.     ED Results / Procedures / Treatments   Labs (all labs ordered are listed, but only abnormal results are displayed) Labs Reviewed  COMPREHENSIVE METABOLIC PANEL - Abnormal; Notable for the following components:      Result Value   Potassium 3.4 (*)    Glucose, Bld 104 (*)  Calcium 10.6 (*)    Total Bilirubin 1.7 (*)    All other components within normal limits  CBC WITH DIFFERENTIAL/PLATELET - Abnormal; Notable for the following components:   Lymphs Abs 4.2 (*)    All other components within normal limits  LIPASE, BLOOD  TROPONIN I (HIGH SENSITIVITY)  TROPONIN I (HIGH SENSITIVITY)    EKG EKG Interpretation  Date/Time:  Monday December 06 2021 05:55:31 EDT Ventricular Rate:  80 PR Interval:  144 QRS Duration: 80 QT Interval:  428 QTC Calculation: 493 R Axis:   88 Text Interpretation: Normal sinus rhythm Prolonged QT Abnormal ECG When compared with ECG of 02-Jul-1999 06:58, PREVIOUS ECG IS PRESENT Confirmed by Octaviano Glow [phone]) on 12/06/2021 9:36:31 AM  Radiology DG Chest 2 View  Result Date: 12/06/2021 CLINICAL DATA:  56 year old female with history of chest pain. EXAM: CHEST - 2 VIEW COMPARISON:  Chest x-ray 02/21/2019. FINDINGS: Lung volumes are normal. No consolidative airspace disease. No pleural effusions. No pneumothorax. No pulmonary nodule or mass noted. Pulmonary vasculature and the cardiomediastinal silhouette are within normal limits. Old healed fractures of the posterolateral aspect of the right ninth and tenth ribs again noted. IMPRESSION: 1. No radiographic  evidence of acute cardiopulmonary disease. Electronically Signed   By: Vinnie Langton M.D.   On: 12/06/2021 06:28    Procedures Procedures    Medications Ordered in ED Medications  alum & mag hydroxide-simeth (MAALOX/MYLANTA) 200-200-20 MG/5ML suspension 30 mL (30 mLs Oral Given 12/06/21 0603)    And  lidocaine (XYLOCAINE) 2 % viscous mouth solution 15 mL (15 mLs Oral Given 12/06/21 6945)    ED Course/ Medical Decision Making/ A&P                           Medical Decision Making  Alert 56 year old female in no acute distress, nontoxic-appearing.  Presents to the emergency department with a chief complaint of chest pain/epigastric pain.  Information was obtained from patient and patient's husband.  I reviewed patient's past medical records including previous provider notes, labs, and imaging.  Patient has medical history as outlined in HPI was complicates her care.  ACS work-up was initiated while patient was in triage.  Due to reports of epigastric pain CMP, lipase were also obtained.    I personally viewed interpret patient's EKG.  Tracing shows sinus rhythm with prolonged QT.  I personally viewed interpret patient's chest x-ray.  Imaging shows no active cardiopulmonary disease  I personally viewed interpret patient's lab results.  Pertinent findings include: -Potassium 3.4 -AST, ALT, alk phos within normal limits -Total bili elevated 1.7 -Lipase within normal limits -CBC unremarkable Troponin 6 and 6 with delta of 0  Patient reports improvement in symptoms after receiving GI cocktail.  On my exam abdomen is soft, nondistended, with tenderness to epigastric area.  With elevated bilirubin and epigastric abdominal pain discussed obtaining CT imaging with patient however she declines at this time.  Patient eloped from the emergency department prior to obtaining second troponin.  Therefore I was unable to discuss follow-up and return precautions with patient.  Patient left AGAINST  MEDICAL ADVICE.        Final Clinical Impression(s) / ED Diagnoses Final diagnoses:  None    Rx / DC Orders ED Discharge Orders     None         Loni Beckwith, PA-C 12/06/21 1051    Wyvonnia Dusky, MD 12/06/21 1505

## 2022-02-07 ENCOUNTER — Observation Stay (HOSPITAL_COMMUNITY)
Admission: EM | Admit: 2022-02-07 | Discharge: 2022-02-08 | Disposition: A | Discharge status: Home / Self Care | Payer: Medicaid Other | Attending: Internal Medicine | Admitting: Internal Medicine

## 2022-02-07 ENCOUNTER — Emergency Department (HOSPITAL_COMMUNITY): Payer: Medicaid Other

## 2022-02-07 ENCOUNTER — Encounter (HOSPITAL_COMMUNITY): Payer: Self-pay

## 2022-02-07 ENCOUNTER — Observation Stay (HOSPITAL_COMMUNITY): Payer: Medicaid Other

## 2022-02-07 ENCOUNTER — Other Ambulatory Visit: Payer: Self-pay

## 2022-02-07 DIAGNOSIS — R202 Paresthesia of skin: Secondary | ICD-10-CM | POA: Diagnosis present

## 2022-02-07 DIAGNOSIS — Z79899 Other long term (current) drug therapy: Secondary | ICD-10-CM | POA: Insufficient documentation

## 2022-02-07 DIAGNOSIS — E785 Hyperlipidemia, unspecified: Secondary | ICD-10-CM | POA: Diagnosis present

## 2022-02-07 DIAGNOSIS — E039 Hypothyroidism, unspecified: Secondary | ICD-10-CM | POA: Insufficient documentation

## 2022-02-07 DIAGNOSIS — R200 Anesthesia of skin: Principal | ICD-10-CM | POA: Insufficient documentation

## 2022-02-07 DIAGNOSIS — I639 Cerebral infarction, unspecified: Secondary | ICD-10-CM | POA: Diagnosis not present

## 2022-02-07 DIAGNOSIS — G459 Transient cerebral ischemic attack, unspecified: Secondary | ICD-10-CM | POA: Insufficient documentation

## 2022-02-07 DIAGNOSIS — I10 Essential (primary) hypertension: Secondary | ICD-10-CM | POA: Diagnosis not present

## 2022-02-07 LAB — COMPREHENSIVE METABOLIC PANEL
ALT: 17 U/L (ref 0–44)
AST: 24 U/L (ref 15–41)
Albumin: 4 g/dL (ref 3.5–5.0)
Alkaline Phosphatase: 37 U/L — ABNORMAL LOW (ref 38–126)
Anion gap: 7 (ref 5–15)
BUN: 9 mg/dL (ref 6–20)
CO2: 24 mmol/L (ref 22–32)
Calcium: 9.5 mg/dL (ref 8.9–10.3)
Chloride: 108 mmol/L (ref 98–111)
Creatinine, Ser: 0.81 mg/dL (ref 0.44–1.00)
GFR, Estimated: >60 mL/min (ref >60)
Glucose, Bld: 102 mg/dL — ABNORMAL HIGH (ref 70–99)
Potassium: 4 mmol/L (ref 3.5–5.1)
Sodium: 139 mmol/L (ref 135–145)
Total Bilirubin: 0.8 mg/dL (ref 0.3–1.2)
Total Protein: 6.8 g/dL (ref 6.5–8.1)

## 2022-02-07 LAB — CBC WITH DIFFERENTIAL/PLATELET
Abs Immature Granulocytes: 0.01 10*3/uL (ref 0.00–0.07)
Basophils Absolute: 0 10*3/uL (ref 0.0–0.1)
Basophils Relative: 1 %
Eosinophils Absolute: 0.2 10*3/uL (ref 0.0–0.5)
Eosinophils Relative: 3 %
HCT: 41.4 % (ref 36.0–46.0)
Hemoglobin: 13.6 g/dL (ref 12.0–15.0)
Immature Granulocytes: 0 %
Lymphocytes Relative: 38 %
Lymphs Abs: 2.5 10*3/uL (ref 0.7–4.0)
MCH: 31.2 pg (ref 26.0–34.0)
MCHC: 32.9 g/dL (ref 30.0–36.0)
MCV: 95 fL (ref 80.0–100.0)
Monocytes Absolute: 0.6 10*3/uL (ref 0.1–1.0)
Monocytes Relative: 9 %
Neutro Abs: 3.1 10*3/uL (ref 1.7–7.7)
Neutrophils Relative %: 49 %
Platelets: 288 10*3/uL (ref 150–400)
RBC: 4.36 MIL/uL (ref 3.87–5.11)
RDW: 12.2 % (ref 11.5–15.5)
WBC: 6.4 10*3/uL (ref 4.0–10.5)
nRBC: 0 % (ref 0.0–0.2)

## 2022-02-07 MED ORDER — ASPIRIN 81 MG PO TBEC
81.0000 mg | DELAYED_RELEASE_TABLET | Freq: Every day | ORAL | Status: DC
  Administered 2022-02-07 – 2022-02-08 (×2): 81 mg via ORAL
  Filled 2022-02-07 (×2): qty 1

## 2022-02-07 MED ORDER — SENNOSIDES-DOCUSATE SODIUM 8.6-50 MG PO TABS: 1.0000 | ORAL_TABLET | Freq: Every evening | ORAL | Status: DC | PRN

## 2022-02-07 MED ORDER — STROKE: EARLY STAGES OF RECOVERY BOOK
Freq: Once | Status: AC
  Filled 2022-02-07: qty 1

## 2022-02-07 MED ORDER — ENOXAPARIN SODIUM 40 MG/0.4ML IJ SOSY
40.0000 mg | PREFILLED_SYRINGE | INTRAMUSCULAR | Status: DC
  Administered 2022-02-07: 40 mg via SUBCUTANEOUS
  Filled 2022-02-07: qty 0.4

## 2022-02-07 MED ORDER — ONDANSETRON HCL 4 MG/2ML IJ SOLN
4.0000 mg | Freq: Four times a day (QID) | INTRAMUSCULAR | Status: DC | PRN
  Administered 2022-02-08: 4 mg via INTRAVENOUS
  Filled 2022-02-07: qty 2

## 2022-02-07 MED ORDER — ACETAMINOPHEN 325 MG PO TABS
650.0000 mg | ORAL_TABLET | ORAL | Status: DC | PRN
  Administered 2022-02-07: 650 mg via ORAL
  Filled 2022-02-07: qty 2

## 2022-02-07 MED ORDER — ROSUVASTATIN CALCIUM 5 MG PO TABS: 10.0000 mg | ORAL_TABLET | Freq: Every day | ORAL | Status: DC

## 2022-02-07 MED ORDER — HYDRALAZINE HCL 20 MG/ML IJ SOLN
10.0000 mg | INTRAMUSCULAR | Status: DC | PRN
  Administered 2022-02-07: 10 mg via INTRAVENOUS
  Filled 2022-02-07: qty 1

## 2022-02-07 MED ORDER — ACETAMINOPHEN 160 MG/5ML PO SOLN: 650.0000 mg | ORAL | Status: DC | PRN

## 2022-02-07 MED ORDER — ACETAMINOPHEN 650 MG RE SUPP: 650.0000 mg | RECTAL | Status: DC | PRN

## 2022-02-07 NOTE — Assessment & Plan Note (Signed)
Has not taken home amlodipine or lisinopril for a couple months.  Allowing permissive hypertension for now. -IV hydralazine as needed

## 2022-02-07 NOTE — ED Provider Triage Note (Signed)
Emergency Medicine Provider Triage Evaluation Note  [name] , a 56 y.o. female  was evaluated in triage.  Pt complains of high blood pressure.  Patient states that she has not had a blood pressure medicine for the past several months.  She reports baseline elevated blood pressures in the range measured today.  Earlier today, she noticed some slowing of her speech as well as left-sided facial tingling/numbness which prompted her visit to the emergency department.  Slowness of speech is since resolved but she still having this left-sided facial tingling/numbness onset approximately 30 minutes ago.  Denies history of CVA.  Denies visual deficits, weakness/sensory deficits in extremities, gait abnormalities, facial droop.  Review of Systems  Positive: See above Negative:   Physical Exam  BP (!) 233/92   Pulse 86   Temp 97.6 F (36.4 C) (Oral)   Resp (!) 22   Ht 5\' 3"  (1.6 m)   Wt 61.2 kg   LMP 11/16/2010   SpO2 99%   BMI 23.91 kg/m  Gen:   Awake, no distress   Resp:  Normal effort  MSK:   Moves extremities without difficulty  Other:  Current of 3 through 12 grossly intact besides left-sided facial numbness in V2/V3 distributions.  PERRLA bilaterally.  EOMs intact bilaterally  Medical Decision Making  Medically screening exam initiated at 5:53 PM.  Appropriate orders placed.  SHARLEEN SZCZESNY was informed that the remainder of the evaluation will be completed by another provider, this initial triage assessment does not replace that evaluation, and the importance of remaining in the ED until their evaluation is complete.     Wilnette Kales, [place] 02/07/22 1756

## 2022-02-07 NOTE — H&P (Signed)
History and Physical    [name] QVZ:[id number] DOB: [birth year] DOA: 02/07/2022  PCP: System, Provider Not In  Patient coming from: Home  I have personally briefly reviewed patient's old medical records in [facility]  Chief Complaint: Slow speech, left facial numbness  HPI: [name] is a 56 y.o. female with medical history significant for HTN, HLD who presented to the ED for evaluation of change in speech and left facial numbness.  Patient states she went to bed at 02/06/2022 in her usual state of health.  Morning of 10/2 she awoke with a headache and slurred speech.  She went back to bed because she was feeling lethargic.  She woke up around 1220 and her significant other again noted that her speech was slow.  Her daughter also noted that her speech was slurred.  Family reports that she appeared to have a left facial droop.  She noted some numbness at her left lower face.  Family brought her to the ED for further evaluation.  Speech and facial weakness/numbness seem to have resolved at this point.  She does have some left upper and lower extremity weakness on exam.  She states that she has not taken any of her antihypertensives or statin for at least 2 months.  Husband reports that she has been under a lot of stress.  She does not take aspirin regularly.  ED Course  Labs/Imaging on admission: I have personally reviewed following labs and imaging studies.  Initial showed BP 235/87, pulse 86, RR 22, temp 97.6 F, SPO2 99% on room air.  Labs show sodium 139, potassium 4.0, bicarb 24, BUN 9, creatinine 0.81, serum glucose 102, WBC 6.4, hemoglobin 13.6, platelets 288,000.  CT head without contrast read as normal head CT.  Neurology consulted and recommended admission for further CVA work-up.  The hospitalist service was consulted to admit for further evaluation and management.  Review of Systems: All systems reviewed and are negative except as documented in history of  present illness above.   Past Medical History:  Diagnosis Date   Anxiety    Depression    Hyperlipidemia    Hypertension    Subclinical hypothyroidism     Past Surgical History:  Procedure Laterality Date   SHOULDER SURGERY     TUBAL LIGATION      Social History:  reports that she has never smoked. She has never used smokeless tobacco. She reports that she does not drink alcohol and does not use drugs.  Allergies  Allergen Reactions   Lipitor [Atorvastatin] Other (See Comments)    Myalgias    Family History  Problem Relation Age of Onset   Healthy Mother    Heart disease Father      Prior to Admission medications   Medication Sig Start Date End Date Taking? Authorizing Provider  amLODipine (NORVASC) 5 MG tablet Take 1 tablet (5 mg total) by mouth daily. 02/21/19   Mardella Layman, MD  fluticasone (FLONASE) 50 MCG/ACT nasal spray Place 1 spray into both nostrils daily. Patient taking differently: Place 1 spray into both nostrils daily as needed for allergies or rhinitis. 09/08/21   Carlisle Beers, FNP  lisinopril (ZESTRIL) 5 MG tablet Take 5 mg by mouth daily. 07/14/21   [provider]  rosuvastatin (CRESTOR) 10 MG tablet Take 10 mg by mouth daily. 07/19/21   [provider]  linaclotide Karlene Einstein) 145 MCG CAPS capsule Take 145 mcg by mouth daily before breakfast.  10/31/18  [provider]    Physical Exam: Vitals:   02/07/22 1749 02/07/22 1955 02/07/22 2150 02/07/22 2210  BP: (!) 233/92 (!) 188/94 (!) 197/102 (!) 248/100  Pulse:  (!) 54 (!) 59 73  Resp:  18 18 18   Temp:   98.2 F (36.8 C)   TempSrc:   Oral   SpO2:  100% 99% 100%  Weight: 61.2 kg     Height: 5\' 3"  (1.6 m)      Constitutional: Resting supine in bed, NAD, calm, comfortable Eyes: PERRL, EOMI, lids and conjunctivae normal ENMT: Mucous membranes are moist. Posterior pharynx clear of any exudate or lesions.Normal dentition.  Neck: normal, supple, no masses. Respiratory:  clear to auscultation bilaterally, no wheezing, no crackles. Normal respiratory effort. No accessory muscle use.  Cardiovascular: Regular rate and rhythm, no murmurs / rubs / gallops. No extremity edema. 2+ pedal pulses. Abdomen: no tenderness, no masses palpated. Musculoskeletal: no clubbing / cyanosis. No joint deformity upper and lower extremities. Good ROM, no contractures. Normal muscle tone.  Skin: no rashes, lesions, ulcers. No induration Neurologic: Speech is fluent.  CN 2-12 grossly intact. Sensation intact. Strength 5/5 in right upper and right lower extremities.  4/5 LUE and LLE. Psychiatric:  Alert and oriented x 3. Normal mood.   EKG: Personally reviewed. Sinus arrhythmia, rate 65, no acute ischemic changes.  Previous EKG 12/06/2021 showed sinus rhythm without arrhythmia.  Assessment/Plan Principal Problem:   Left facial numbness Active Problems:   Hypertension   Hyperlipidemia   [name] is a 56 y.o. female with medical history significant for HTN, HLD who presented with dysarthria and left facial numbness and is admitted for CVA work-up.  Assessment and Plan: * Left facial numbness Presenting with left facial numbness and droop, slurred speech.  These have resolved but has left-sided weakness on exam.  Neurology consulted and recommended admission for further CVA work-up. -CT head negative for acute changes -MRI brain in process -CTA head/neck -Obtain echocardiogram -Start aspirin 81 mg daily -Keep on telemetry, continue neurochecks -Check A1c and lipid panel -PT/OT/SLP eval -Allow permissive hypertension for now; IV hydralazine as needed for BP >220/110  Hypertension Has not taken home amlodipine or lisinopril for a couple months.  Allowing permissive hypertension for now. -IV hydralazine as needed  Hyperlipidemia Resume home rosuvastatin 10 mg daily.  DVT prophylaxis: enoxaparin (LOVENOX) injection 40 mg Start: 02/07/22 2315 Code Status: Full code,  confirmed with patient on admission Family Communication: Husband at bedside Disposition Plan: From home and likely discharge to home pending clinical progress Consults called: Neurology Severity of Illness: The appropriate patient status for this patient is OBSERVATION. Observation status is judged to be reasonable and necessary in order to provide the required intensity of service to ensure the patient's safety. The patient's presenting symptoms, physical exam findings, and initial radiographic and laboratory data in the context of their medical condition is felt to place them at decreased risk for further clinical deterioration. Furthermore, it is anticipated that the patient will be medically stable for discharge from the hospital within 2 midnights of admission.   Zada Finders MD [facility]  If 7PM-7AM, please contact night-coverage [email or web address]  02/07/2022, 11:17 PM

## 2022-02-07 NOTE — ED Provider Notes (Signed)
[facility] EMERGENCY DEPARTMENT Provider Note   CSN: [id number] Arrival date & time: 02/07/22  1718     History  Chief Complaint  Patient presents with   Hypertension    [name] is a 56 y.o. female.   Hypertension  Patient presents with high blood pressure and difficulty speaking.  Reportedly has had blood blood pressure has been off her medicine for months now.  Reportedly had difficulty speaking.  Reportedly had some tingling in left side of her face.  Woke up with dizziness.  States she feels better now however.  No chest pain.  Denies numbness or weakness.  Did have some pain on the left side of her neck with it.  States blood pressure is very high.    Past Medical History:  Diagnosis Date   Anxiety    Depression    Hyperlipidemia    Hypertension    Subclinical hypothyroidism     Home Medications Prior to Admission medications   Medication Sig Start Date End Date Taking? Authorizing Provider  fluticasone (FLONASE) 50 MCG/ACT nasal spray Place 1 spray into both nostrils daily. 09/08/21  Yes Carlisle Beers, FNP  amLODipine (NORVASC) 5 MG tablet Take 1 tablet (5 mg total) by mouth daily. Patient not taking: Reported on 02/07/2022 02/21/19   Mardella Layman, MD  lisinopril (ZESTRIL) 5 MG tablet Take 5 mg by mouth daily. Patient not taking: Reported on 02/07/2022 07/14/21   [provider]  rosuvastatin (CRESTOR) 10 MG tablet Take 10 mg by mouth daily. Patient not taking: Reported on 02/07/2022 07/19/21   [provider]  linaclotide Karlene Einstein) 145 MCG CAPS capsule Take 145 mcg by mouth daily before breakfast.  10/31/18  [provider]      Allergies    Lipitor [atorvastatin]    Review of Systems   Review of Systems  Physical Exam Updated Vital Signs BP (!) 248/100 (BP Location: Right Arm)   Pulse 73   Temp 98.2 F (36.8 C) (Oral)   Resp 18   Ht 5\' 3"  (1.6 m)   Wt 61.2 kg   LMP 11/16/2010   SpO2 100%   BMI  23.91 kg/m  Physical Exam Vitals and nursing note reviewed.  HENT:     Head: Atraumatic.  Eyes:     Pupils: Pupils are equal, round, and reactive to light.  Cardiovascular:     Rate and Rhythm: Regular rhythm.  Abdominal:     Tenderness: There is no abdominal tenderness.  Musculoskeletal:        General: No tenderness.     Cervical back: Neck supple.  Skin:    General: Skin is warm.  Neurological:     General: No focal deficit present.     Mental Status: She is alert and oriented to person, place, and time.     ED Results / Procedures / Treatments   Labs (all labs ordered are listed, but only abnormal results are displayed) Labs Reviewed  COMPREHENSIVE METABOLIC PANEL - Abnormal; Notable for the following components:      Result Value   Glucose, Bld 102 (*)    Alkaline Phosphatase 37 (*)    All other components within normal limits  CBC WITH DIFFERENTIAL/PLATELET  HIV ANTIBODY (ROUTINE TESTING W REFLEX)  LIPID PANEL  HEMOGLOBIN A1C  CBC  BASIC METABOLIC PANEL    EKG EKG Interpretation  Date/Time:  Monday February 07 2022 17:36:00 EDT Ventricular Rate:  65 PR Interval:  148 QRS Duration: 82  QT Interval:  430 QTC Calculation: 447 R Axis:   65 Text Interpretation: Normal sinus rhythm with sinus arrhythmia Normal ECG When compared with ECG of 06-Dec-2021 05:55, QT has shortened Confirmed by Delora Fuel ([id number]) on 02/07/2022 11:38:26 PM  Radiology No results found.  Procedures Procedures    Medications Ordered in ED Medications  hydrALAZINE (APRESOLINE) injection 10 mg (10 mg Intravenous Given 02/07/22 2301)   stroke: early stages of recovery book (has no administration in time range)  acetaminophen (TYLENOL) tablet 650 mg (has no administration in time range)    Or  acetaminophen (TYLENOL) 160 MG/5ML solution 650 mg (has no administration in time range)    Or  acetaminophen (TYLENOL) suppository 650 mg (has no administration in time range)  senna-docusate  (Senokot-S) tablet 1 tablet (has no administration in time range)  enoxaparin (LOVENOX) injection 40 mg (has no administration in time range)  ondansetron (ZOFRAN) injection 4 mg (has no administration in time range)  aspirin EC tablet 81 mg (has no administration in time range)  rosuvastatin (CRESTOR) tablet 10 mg (has no administration in time range)    ED Course/ Medical Decision Making/ A&P                           Medical Decision Making Amount and/or Complexity of Data Reviewed Radiology: ordered.  Risk Decision regarding hospitalization.   Patient presented with hypertension and difficulty speaking.  Some mild neck pain.  Doubt cardiac ischemia but is worrisome for TIA.  Head CT done and reassuring.  Symptoms are resolved but did last hours.  ABCD2 score is 4 which would require admission to the hospital.  Discussed with neurology who saw patient.  Recommends work-up.  Also had hypertension that got rather high.  Given some IV labetalol.  Want to keep systolic below 865.  Will admit to hospital.  CRITICAL CARE Performed by: Davonna Belling Total critical care time: 30 8 good worried Monday command information again take it take it for something we did that we had DC for [place] we had results of the traction I minutes Critical care time was exclusive of separately billable procedures and treating other patients. Critical care was necessary to treat or prevent imminent or life-threatening deterioration. Critical care was time spent personally by me on the following activities: development of treatment plan with patient and/or surrogate as well as nursing, discussions with consultants, evaluation of patient's response to treatment, examination of patient, obtaining history from patient or surrogate, ordering and performing treatments and interventions, ordering and review of laboratory studies, ordering and review of radiographic studies, pulse oximetry and re-evaluation of patient's  condition.         Final Clinical Impression(s) / ED Diagnoses Final diagnoses:  Hypertension, unspecified type  TIA (transient ischemic attack)    Rx / DC Orders ED Discharge Orders     None         Davonna Belling, MD 02/07/22 2355

## 2022-02-07 NOTE — Assessment & Plan Note (Signed)
Resume home rosuvastatin 10 mg daily.

## 2022-02-07 NOTE — ED Triage Notes (Signed)
Patient here for HTN and has not been taking her medication. She is suppose to take amlodipine 5mg  and lisinopril 5mg .  Patient reports she woke up with slow speech this am but went back to bed bc she has been feeling extremely fatigue.  Woke up around 1220 and bf reports her speech was slow.  Reports 30 minutes ago started  having left facial  numbness.  No other neuro symptoms reports she always has bad vision.

## 2022-02-07 NOTE — Hospital Course (Signed)
[name] is a 56 y.o. female with medical history significant for HTN, HLD who presented with dysarthria and left facial numbness and is admitted for CVA work-up.

## 2022-02-07 NOTE — Assessment & Plan Note (Signed)
Presenting with left facial numbness and droop, slurred speech.  These have resolved but has left-sided weakness on exam.  Neurology consulted and recommended admission for further CVA work-up. -CT head negative for acute changes -MRI brain in process -CTA head/neck -Obtain echocardiogram -Start aspirin 81 mg daily -Keep on telemetry, continue neurochecks -Check A1c and lipid panel -PT/OT/SLP eval -Allow permissive hypertension for now; IV hydralazine as needed for BP >220/110

## 2022-02-08 ENCOUNTER — Observation Stay (HOSPITAL_BASED_OUTPATIENT_CLINIC_OR_DEPARTMENT_OTHER): Payer: Medicaid Other

## 2022-02-08 ENCOUNTER — Other Ambulatory Visit (HOSPITAL_COMMUNITY): Payer: Self-pay

## 2022-02-08 ENCOUNTER — Observation Stay (HOSPITAL_COMMUNITY): Payer: Medicaid Other

## 2022-02-08 ENCOUNTER — Other Ambulatory Visit: Payer: Self-pay | Admitting: Neurology

## 2022-02-08 DIAGNOSIS — I639 Cerebral infarction, unspecified: Secondary | ICD-10-CM

## 2022-02-08 DIAGNOSIS — G459 Transient cerebral ischemic attack, unspecified: Secondary | ICD-10-CM | POA: Diagnosis not present

## 2022-02-08 DIAGNOSIS — E7800 Pure hypercholesterolemia, unspecified: Secondary | ICD-10-CM | POA: Diagnosis not present

## 2022-02-08 DIAGNOSIS — I10 Essential (primary) hypertension: Secondary | ICD-10-CM

## 2022-02-08 DIAGNOSIS — I63311 Cerebral infarction due to thrombosis of right middle cerebral artery: Secondary | ICD-10-CM

## 2022-02-08 DIAGNOSIS — R200 Anesthesia of skin: Secondary | ICD-10-CM | POA: Diagnosis not present

## 2022-02-08 LAB — CBC
HCT: 39.9 % (ref 36.0–46.0)
Hemoglobin: 13.1 g/dL (ref 12.0–15.0)
MCH: 31 pg (ref 26.0–34.0)
MCHC: 32.8 g/dL (ref 30.0–36.0)
MCV: 94.5 fL (ref 80.0–100.0)
Platelets: 258 10*3/uL (ref 150–400)
RBC: 4.22 MIL/uL (ref 3.87–5.11)
RDW: 12.4 % (ref 11.5–15.5)
WBC: 7.8 10*3/uL (ref 4.0–10.5)
nRBC: 0 % (ref 0.0–0.2)

## 2022-02-08 LAB — ECHOCARDIOGRAM COMPLETE
Area-P 1/2: 3.19 cm2
Calc EF: 46 %
Height: 63 in
S' Lateral: 1.88 cm
Single Plane A2C EF: 50.3 %
Single Plane A4C EF: 44.4 %
Weight: 2160 oz

## 2022-02-08 LAB — LIPID PANEL
Cholesterol: 253 mg/dL — ABNORMAL HIGH (ref 0–200)
HDL: 53 mg/dL (ref >40)
LDL Cholesterol: 153 mg/dL — ABNORMAL HIGH (ref 0–99)
Total CHOL/HDL Ratio: 4.8 RATIO
Triglycerides: 237 mg/dL — ABNORMAL HIGH (ref <150)
VLDL: 47 mg/dL — ABNORMAL HIGH (ref 0–40)

## 2022-02-08 LAB — BASIC METABOLIC PANEL
Anion gap: 8 (ref 5–15)
BUN: 7 mg/dL (ref 6–20)
CO2: 23 mmol/L (ref 22–32)
Calcium: 9 mg/dL (ref 8.9–10.3)
Chloride: 107 mmol/L (ref 98–111)
Creatinine, Ser: 0.79 mg/dL (ref 0.44–1.00)
GFR, Estimated: >60 mL/min (ref >60)
Glucose, Bld: 108 mg/dL — ABNORMAL HIGH (ref 70–99)
Potassium: 3.6 mmol/L (ref 3.5–5.1)
Sodium: 138 mmol/L (ref 135–145)

## 2022-02-08 LAB — HEMOGLOBIN A1C
Hgb A1c MFr Bld: 5.2 % (ref 4.8–5.6)
Mean Plasma Glucose: 102.54 mg/dL

## 2022-02-08 LAB — HIV ANTIBODY (ROUTINE TESTING W REFLEX): HIV Screen 4th Generation wRfx: NONREACTIVE

## 2022-02-08 MED ORDER — ROSUVASTATIN CALCIUM 20 MG PO TABS
40.0000 mg | ORAL_TABLET | Freq: Every day | ORAL | Status: DC
  Administered 2022-02-08: 40 mg via ORAL
  Filled 2022-02-08: qty 2

## 2022-02-08 MED ORDER — IOHEXOL 350 MG/ML SOLN
75.0000 mL | Freq: Once | INTRAVENOUS | Status: AC | PRN
  Administered 2022-02-08: 75 mL via INTRAVENOUS

## 2022-02-08 MED ORDER — ROSUVASTATIN CALCIUM 40 MG PO TABS
40.0000 mg | ORAL_TABLET | Freq: Every day | ORAL | 0 refills | Status: DC
  Filled 2022-02-08: qty 30, 30d supply, fill #0

## 2022-02-08 MED ORDER — AMLODIPINE BESYLATE 5 MG PO TABS
5.0000 mg | ORAL_TABLET | Freq: Every day | ORAL | 0 refills | Status: DC
  Filled 2022-02-08: qty 30, 30d supply, fill #0

## 2022-02-08 MED ORDER — CLOPIDOGREL BISULFATE 75 MG PO TABS: 75.0000 mg | ORAL_TABLET | Freq: Every day | ORAL | Status: DC

## 2022-02-08 MED ORDER — CLOPIDOGREL BISULFATE 75 MG PO TABS
75.0000 mg | ORAL_TABLET | Freq: Every day | ORAL | 0 refills | Status: DC
  Filled 2022-02-08: qty 20, 20d supply, fill #0

## 2022-02-08 MED ORDER — ASPIRIN 81 MG PO TBEC
81.0000 mg | DELAYED_RELEASE_TABLET | Freq: Every day | ORAL | 12 refills | Status: AC
  Filled 2022-02-08: qty 30, 30d supply, fill #0

## 2022-02-08 MED ORDER — CLOPIDOGREL BISULFATE 300 MG PO TABS
300.0000 mg | ORAL_TABLET | Freq: Once | ORAL | Status: AC
  Administered 2022-02-08: 300 mg via ORAL
  Filled 2022-02-08: qty 1

## 2022-02-08 MED ORDER — STUDY - LIBREXIA-STROKE - MILVEXIAN 25 MG OR PLACEBO TABLET (PI-SETHI)
1.0000 | ORAL_TABLET | Freq: Two times a day (BID) | ORAL | Status: DC
  Administered 2022-02-08: 1 via ORAL
  Filled 2022-02-08 (×2): qty 1

## 2022-02-08 NOTE — Discharge Summary (Signed)
[name] ZOX:[id number] DOB: 1965/09/27 DOA: 02/07/2022  PCP: System, Provider Not In  Admit date: 02/07/2022  Discharge date: 02/08/2022  Admitted From: Home   Disposition:  Home   Recommendations for Outpatient Follow-up:   Follow up with PCP in 1-2 weeks  PCP Please obtain BMP/CBC, 2 view CXR in 1week,  (see Discharge instructions)   PCP Please follow up on the following pending results:    Home Health: None   Equipment/Devices: None  Consultations: Neuro Discharge Condition: Stable    CODE STATUS: Full    Diet Recommendation: Heart Healthy   Diet Order             DIET SOFT Room service appropriate? Yes; Fluid consistency: Thin  Diet effective now           Diet - low sodium heart healthy                    Chief Complaint  Patient presents with   Hypertension     Brief history of present illness from the day of admission and additional interim summary    56 y.o. female with medical history significant for HTN, HLD who presented to the ED for evaluation of change in speech and left facial numbness.   Patient states she went to bed at 02/06/2022 in her usual state of health.  Morning of 10/2 she awoke with a headache and slurred speech.  She went back to bed because she was feeling lethargic.  She woke up around 1220 and her significant other again noted that her speech was slow.  Her daughter also noted that her speech was slurred.  Family reports that she appeared to have a left facial droop.  She noted some numbness at her left lower face.                                                                 Hospital Course    Left facial numbness - Presenting with left facial numbness and droop, slurred speech, MRI positive for acute CVA, seen by stroke team and full workup done, symptoms  largely resolved, will get 21 days of DAPT then ASA only, statin dose increased as LDL was gih, stable A1c, CTA head and Neck and Echo. Permissive HTN today, Neuro will enroll her in a CVA study as well.    Hypertension Has not taken home amlodipine or lisinopril for a couple months.  Allowing permissive hypertension for now. Low dose Norvasc only from tomorrow then per PCP.   Hyperlipidemia Rosuvastatin 40 mg daily.     Discharge diagnosis     Principal Problem:   Left facial numbness Active Problems:   Hypertension   Hyperlipidemia    Discharge instructions    Discharge Instructions  Diet - low sodium heart healthy   Complete by: As directed    Discharge instructions   Complete by: As directed    Follow with Primary MD  in 7 days   Get CBC, CMP,   -  checked next visit within 1 week by Primary MD   Activity: As tolerated with Full fall precautions use walker/cane & assistance as needed  Disposition Home   Diet: Heart Healthy   Special Instructions: If you have smoked or chewed Tobacco  in the last 2 yrs please stop smoking, stop any regular Alcohol  and or any Recreational drug use.  On your next visit with your primary care physician please Get Medicines reviewed and adjusted.  Please request your Prim.MD to go over all Hospital Tests and Procedure/Radiological results at the follow up, please get all Hospital records sent to your Prim MD by signing hospital release before you go home.  If you experience worsening of your admission symptoms, develop shortness of breath, life threatening emergency, suicidal or homicidal thoughts you must seek medical attention immediately by calling 911 or calling your MD immediately  if symptoms less severe.  You Must read complete instructions/literature along with all the possible adverse reactions/side effects for all the Medicines you take and that have been prescribed to you. Take any new Medicines after you have completely  understood and accpet all the possible adverse reactions/side effects.   Increase activity slowly   Complete by: As directed        Discharge Medications   Allergies as of 02/08/2022       Reactions   Lipitor [atorvastatin] Other (See Comments)   Myalgias        Medication List     STOP taking these medications    lisinopril 5 MG tablet Commonly known as: ZESTRIL       TAKE these medications    amLODipine 5 MG tablet Commonly known as: NORVASC Take 1 tablet (5 mg total) by mouth daily. Start taking on: February 09, 2022   Aspirin Low Dose 81 MG tablet Generic drug: aspirin EC Take 1 tablet (81 mg total) by mouth daily. Swallow whole. Start taking on: February 09, 2022   clopidogrel 75 MG tablet Commonly known as: PLAVIX Take 1 tablet (75 mg total) by mouth daily. Start taking on: February 09, 2022   fluticasone 50 MCG/ACT nasal spray Commonly known as: FLONASE Place 1 spray into both nostrils daily.   rosuvastatin 40 MG tablet Commonly known as: CRESTOR Take 1 tablet (40 mg total) by mouth daily. What changed:  medication strength how much to take         Follow-up Information     Your PCP. Schedule an appointment as soon as possible for a visit in 1 week(s).          [facility]. Schedule an appointment as soon as possible for a visit in 1 week(s).   Contact information: [address]     [address]  [place] [address] [place]. Schedule an appointment as soon as possible for a visit in 1 week(s).   Contact information: [address] [place] [phone]                Major procedures and Radiology Reports - PLEASE review detailed and final reports thoroughly  -        ECHOCARDIOGRAM COMPLETE  Result Date: 02/08/2022    ECHOCARDIOGRAM REPORT   Patient Name:   [name] Date of Exam:  02/08/2022 Medical Rec #:  [id number]      Height:       63.0 in Accession #:    [id number]     Weight:       135.0 lb Date of Birth:  09-25-1965     BSA:          1.636 m Patient Age:    55 years       BP:           158/87 mmHg Patient Gender: F              HR:           60 bpm. Exam Location:  Inpatient Procedure: 2D Echo, Cardiac Doppler and Color Doppler Indications:    Stroke  History:        Patient has no prior history of Echocardiogram examinations.                 Risk Factors:Hypertension and Dyslipidemia.  Sonographer:    Milda SmartShannon O'Grady Referring Phys: [id number] VISHAL R PATEL  Sonographer Comments: Technically difficult study due to poor echo windows. Image acquisition challenging due to patient body habitus and Image acquisition challenging due to respiratory motion. IMPRESSIONS  1. Left ventricular ejection fraction, by estimation, is 60 to 65%. The left ventricle has normal function. The left ventricle has no regional wall motion abnormalities. Left ventricular diastolic parameters were normal.  2. Right ventricular systolic function is normal. The right ventricular size is normal. Tricuspid regurgitation signal is inadequate for assessing PA pressure.  3. The mitral valve is normal in structure. Trivial mitral valve regurgitation. No evidence of mitral stenosis.  4. The aortic valve is normal in structure. Aortic valve regurgitation is not visualized. No aortic stenosis is present.  5. The inferior vena cava is normal in size with greater than 50% respiratory variability, suggesting right atrial pressure of 3 mmHg. FINDINGS  Left Ventricle: Left ventricular ejection fraction, by estimation, is 60 to 65%. The left ventricle has normal function. The left ventricle has no regional wall motion abnormalities. The left ventricular internal cavity size was normal in size. There is  no left ventricular hypertrophy. Left ventricular diastolic parameters were normal. Right Ventricle: The right ventricular size  is normal. No increase in right ventricular wall thickness. Right ventricular systolic function is normal. Tricuspid regurgitation signal is inadequate for assessing PA pressure. Left Atrium: Left atrial size was normal in size. Right Atrium: Right atrial size was normal in size. Pericardium: There is no evidence of pericardial effusion. Mitral Valve: The mitral valve is normal in structure. Trivial mitral valve regurgitation. No evidence of mitral valve stenosis. Tricuspid Valve: The tricuspid valve is normal in structure. Tricuspid valve regurgitation is trivial. No evidence of tricuspid stenosis. Aortic Valve: The aortic valve is normal in structure. Aortic valve regurgitation is not visualized. No aortic stenosis is present. Pulmonic Valve: The pulmonic valve was normal in structure. Pulmonic valve regurgitation is not visualized. No evidence of pulmonic stenosis. Aorta: The aortic root is normal in size and structure. Venous: The inferior vena cava is normal in size with greater than 50% respiratory variability, suggesting right atrial pressure of 3 mmHg. IAS/Shunts: No atrial level shunt detected by color flow Doppler.  LEFT VENTRICLE PLAX 2D LVIDd:         3.70 cm     Diastology LVIDs:  1.88 cm     LV e' medial:    7.62 cm/s LV PW:         0.90 cm     LV E/e' medial:  8.9 LV IVS:        0.90 cm     LV e' lateral:   11.60 cm/s LVOT diam:     2.00 cm     LV E/e' lateral: 5.9 LV SV:         66 LV SV Index:   40 LVOT Area:     3.14 cm  LV Volumes (MOD) LV vol d, MOD A2C: 55.1 ml LV vol d, MOD A4C: 73.4 ml LV vol s, MOD A2C: 27.4 ml LV vol s, MOD A4C: 40.8 ml LV SV MOD A2C:     27.7 ml LV SV MOD A4C:     73.4 ml LV SV MOD BP:      29.4 ml RIGHT VENTRICLE             IVC RV S prime:     10.30 cm/s  IVC diam: 1.80 cm TAPSE (M-mode): 1.4 cm LEFT ATRIUM             Index        RIGHT ATRIUM          Index LA diam:        3.50 cm 2.14 cm/m   RA Area:     8.94 cm LA Vol (A2C):   18.8 ml 11.49 ml/m  RA Volume:    14.20 ml 8.68 ml/m LA Vol (A4C):   25.8 ml 15.77 ml/m LA Biplane Vol: 22.2 ml 13.57 ml/m  AORTIC VALVE LVOT Vmax:   92.60 cm/s LVOT Vmean:  64.000 cm/s LVOT VTI:    0.209 m  AORTA Ao Root diam: 2.90 cm Ao Asc diam:  3.10 cm MITRAL VALVE MV Area (PHT): 3.19 cm    SHUNTS MV Decel Time: 238 msec    Systemic VTI:  0.21 m MV E velocity: 68.10 cm/s  Systemic Diam: 2.00 cm MV A velocity: 85.40 cm/s MV E/A ratio:  0.80 Mihai Croitoru MD Electronically signed by Thurmon Fair MD Signature Date/Time: 02/08/2022/10:49:39 AM    Final    CT ANGIO HEAD NECK W WO CM  Result Date: 02/08/2022 CLINICAL DATA:  Facial numbness EXAM: CT ANGIOGRAPHY HEAD AND NECK TECHNIQUE: Multidetector CT imaging of the head and neck was performed using the standard protocol during bolus administration of intravenous contrast. Multiplanar CT image reconstructions and MIPs were obtained to evaluate the vascular anatomy. Carotid stenosis measurements (when applicable) are obtained utilizing NASCET criteria, using the distal internal carotid diameter as the denominator. RADIATION DOSE REDUCTION: This exam was performed according to the departmental dose-optimization program which includes automated exposure control, adjustment of the mA and/or kV according to patient size and/or use of iterative reconstruction technique. CONTRAST:  36mL OMNIPAQUE IOHEXOL 350 MG/ML SOLN COMPARISON:  None Available. FINDINGS: CTA NECK FINDINGS SKELETON: There is no bony spinal canal stenosis. No lytic or blastic lesion. OTHER NECK: Normal pharynx, larynx and major salivary glands. No cervical lymphadenopathy. Unremarkable thyroid gland. UPPER CHEST: No pneumothorax or pleural effusion. No nodules or masses. AORTIC ARCH: There is no calcific atherosclerosis of the aortic arch. There is no aneurysm, dissection or hemodynamically significant stenosis of the visualized portion of the aorta. Conventional 3 vessel aortic branching pattern. The visualized proximal  subclavian arteries are widely patent. RIGHT CAROTID SYSTEM: Normal without aneurysm, dissection or stenosis. LEFT  CAROTID SYSTEM: Normal without aneurysm, dissection or stenosis. VERTEBRAL ARTERIES: Left dominant configuration. Both origins are clearly patent. There is no dissection, occlusion or flow-limiting stenosis to the skull base (V1-V3 segments). CTA HEAD FINDINGS POSTERIOR CIRCULATION: --Vertebral arteries: Normal V4 segments. --Inferior cerebellar arteries: Normal. --Basilar artery: Normal. --Superior cerebellar arteries: Normal. --Posterior cerebral arteries (PCA): Normal. ANTERIOR CIRCULATION: --Intracranial internal carotid arteries: Atherosclerotic calcification of the internal carotid arteries at the skull base without hemodynamically significant stenosis. --Anterior cerebral arteries (ACA): Normal. Both A1 segments are present. Patent anterior communicating artery (a-comm). --Middle cerebral arteries (MCA): Normal. VENOUS SINUSES: As permitted by contrast timing, patent. ANATOMIC VARIANTS: None Review of the MIP images confirms the above findings. IMPRESSION: No emergent large vessel occlusion or hemodynamically significant stenosis of the head or neck. Electronically Signed   By: Deatra Robinson M.D.   On: 02/08/2022 00:49   MR BRAIN WO CONTRAST  Result Date: 02/08/2022 CLINICAL DATA:  Acute neurologic deficit EXAM: MRI HEAD WITHOUT CONTRAST TECHNIQUE: Multiplanar, multiecho pulse sequences of the brain and surrounding structures were obtained without intravenous contrast. COMPARISON:  None Available. 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 No acute or chronic hemorrhage. Normal white matter signal, parenchymal volume and CSF spaces. The midline structures are normal. Vascular: Major flow voids are preserved. Skull and upper cervical spine: Normal calvarium and skull base. Visualized upper cervical spine and soft tissues are normal. Sinuses/Orbits:No paranasal sinus  fluid levels or advanced mucosal thickening. No mastoid or middle ear effusion. Normal orbits. 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 No hemorrhage or mass effect. Electronically Signed   By: Deatra Robinson M.D.   On: 02/08/2022 00:04   CT Head Wo Contrast  Result Date: 02/07/2022 CLINICAL DATA:  Acute neurologic deficit EXAM: CT HEAD WITHOUT CONTRAST TECHNIQUE: Contiguous axial images were obtained from the base of the skull through the vertex without intravenous contrast. RADIATION DOSE REDUCTION: This exam was performed according to the departmental dose-optimization program which includes automated exposure control, adjustment of the mA and/or kV according to patient size and/or use of iterative reconstruction technique. COMPARISON:  None Available. FINDINGS: Brain: There is no mass, hemorrhage or extra-axial collection. The size and configuration of the ventricles and extra-axial CSF spaces are normal. The brain parenchyma is normal, without acute or chronic infarction. Vascular: No abnormal hyperdensity of the major intracranial arteries or dural venous sinuses. No intracranial atherosclerosis. Skull: The visualized skull base, calvarium and extracranial soft tissues are normal. Sinuses/Orbits: No fluid levels or advanced mucosal thickening of the visualized paranasal sinuses. No mastoid or middle ear effusion. The orbits are normal. IMPRESSION: Normal head CT. Electronically Signed   By: Deatra Robinson M.D.   On: 02/07/2022 19:51    Micro Results    No results found for this or any previous visit (from the past 240 hour(s)).  Today   Subjective    Leenah Seidner today has no headache,no chest abdominal pain,no new weakness tingling or numbness, feels much better wants to go home today.    Objective   Blood pressure (!) 199/91, pulse 67, temperature (!) 97.5 F (36.4 C), resp. rate 20, height 5\' 3"  (1.6 m), weight 61.2 kg, last menstrual period 11/16/2010, SpO2 100  %.  No intake or output data in the 24 hours ending 02/08/22 1537  Exam  Awake Alert, mild Best facial droop,    [place].AT,PERRAL Supple Neck,   Symmetrical Chest wall movement, Good air movement bilaterally, CTAB RRR,No Gallops,   +  ve B.Sounds, Abd Soft, Non tender,  No Cyanosis, Clubbing or edema    Data Review   Recent Labs  Lab 02/07/22 1754 02/08/22 0344  WBC 6.4 7.8  HGB 13.6 13.1  HCT 41.4 39.9  PLT 288 258  MCV 95.0 94.5  MCH 31.2 31.0  MCHC 32.9 32.8  RDW 12.2 12.4  LYMPHSABS 2.5  --   MONOABS 0.6  --   EOSABS 0.2  --   BASOSABS 0.0  --     Recent Labs  Lab 02/07/22 1754 02/08/22 0344  NA 139 138  K 4.0 3.6  CL 108 107  CO2 24 23  GLUCOSE 102* 108*  BUN 9 7  CREATININE 0.81 0.79  CALCIUM 9.5 9.0  AST 24  --   ALT 17  --   ALKPHOS 37*  --   BILITOT 0.8  --   ALBUMIN 4.0  --   HGBA1C  --  5.2    Total Time in preparing paper work, data evaluation and todays exam - 35 minutes  Susa Raring M.D on 02/08/2022 at 3:37 PM  [facility]

## 2022-02-08 NOTE — Progress Notes (Signed)
SLP Cancellation Note  Patient Details Name: [name] MRN: [id number] DOB: January 16, 1966   Cancelled treatment:       Reason Eval/Treat Not Completed: Patient at procedure or test/unavailable.  She passed [facility] swallow screen; SLP swallow eval not warrented per protocol. Will f/u for speech/language evaluation as schedule allows.  Savon Cobbs L. Tivis Ringer, MA CCC/SLP Clinical Specialist - Acute Care SLP Acute Rehabilitation Services Office number [phone]    Juan Quam Laurice 02/08/2022, 9:46 AM

## 2022-02-08 NOTE — Discharge Instructions (Signed)
Follow with Primary MD  in 7 days   Get CBC, CMP,   -  checked next visit within 1 week by Primary MD   Activity: As tolerated with Full fall precautions use walker/cane & assistance as needed  Disposition Home   Diet: Heart Healthy   Special Instructions: If you have smoked or chewed Tobacco  in the last 2 yrs please stop smoking, stop any regular Alcohol  and or any Recreational drug use.  On your next visit with your primary care physician please Get Medicines reviewed and adjusted.  Please request your Prim.MD to go over all Hospital Tests and Procedure/Radiological results at the follow up, please get all Hospital records sent to your Prim MD by signing hospital release before you go home.  If you experience worsening of your admission symptoms, develop shortness of breath, life threatening emergency, suicidal or homicidal thoughts you must seek medical attention immediately by calling 911 or calling your MD immediately  if symptoms less severe.  You Must read complete instructions/literature along with all the possible adverse reactions/side effects for all the Medicines you take and that have been prescribed to you. Take any new Medicines after you have completely understood and accpet all the possible adverse reactions/side effects.

## 2022-02-08 NOTE — Progress Notes (Signed)
  Echocardiogram 2D Echocardiogram has been performed.  [name] 02/08/2022, 10:13 AM

## 2022-02-08 NOTE — Progress Notes (Addendum)
STROKE TEAM PROGRESS NOTE   INTERVAL HISTORY Patient reports no concerns this morning. Says she was not able to take mediations due to losing her job and not being able to afford the medications. She now has medicaid so she says she will be able to obtain medications now.   Vitals:   02/08/22 0658 02/08/22 0727 02/08/22 0745 02/08/22 1057  BP:  (!) 189/106 (!) 149/86 (!) 199/91  Pulse:  72 63 67  Resp:  15 11 20   Temp: 97.6 F (36.4 C)   (!) 97.5 F (36.4 C)  TempSrc: Oral     SpO2:  100% 100% 100%  Weight:      Height:       CBC:  Recent Labs  Lab 02/07/22 1754 02/08/22 0344  WBC 6.4 7.8  NEUTROABS 3.1  --   HGB 13.6 13.1  HCT 41.4 39.9  MCV 95.0 94.5  PLT 288 742   Basic Metabolic Panel:  Recent Labs  Lab 02/07/22 1754 02/08/22 0344  NA 139 138  K 4.0 3.6  CL 108 107  CO2 24 23  GLUCOSE 102* 108*  BUN 9 7  CREATININE 0.81 0.79  CALCIUM 9.5 9.0   Lipid Panel:  Recent Labs  Lab 02/08/22 0344  CHOL 253*  TRIG 237*  HDL 53  CHOLHDL 4.8  VLDL 47*  LDLCALC 153*   HgbA1c:  Recent Labs  Lab 02/08/22 0344  HGBA1C 5.2   Urine Drug Screen: No results for input(s): "LABOPIA", "COCAINSCRNUR", "LABBENZ", "AMPHETMU", "THCU", "LABBARB" in the last 168 hours.  Alcohol Level No results for input(s): "ETH" in the last 168 hours.  IMAGING past 24 hours ECHOCARDIOGRAM COMPLETE  Result Date: 02/08/2022    ECHOCARDIOGRAM REPORT   Patient Name:   [name] [facility] Date of Exam: 02/08/2022 Medical Rec #:  [id number]      Height:       63.0 in Accession #:    [id number]     Weight:       135.0 lb Date of Birth:  04/25/1966     BSA:          1.636 m Patient Age:    56 years       BP:           158/87 mmHg Patient Gender: F              HR:           60 bpm. Exam Location:  Inpatient Procedure: 2D Echo, Cardiac Doppler and Color Doppler Indications:    Stroke  History:        Patient has no prior history of Echocardiogram examinations.                 Risk Factors:Hypertension  and Dyslipidemia.  Sonographer:    Eartha Inch Referring Phys: [id number] VISHAL R PATEL  Sonographer Comments: Technically difficult study due to poor echo windows. Image acquisition challenging due to patient body habitus and Image acquisition challenging due to respiratory motion. IMPRESSIONS  1. Left ventricular ejection fraction, by estimation, is 60 to 65%. The left ventricle has normal function. The left ventricle has no regional wall motion abnormalities. Left ventricular diastolic parameters were normal.  2. Right ventricular systolic function is normal. The right ventricular size is normal. Tricuspid regurgitation signal is inadequate for assessing PA pressure.  3. The mitral valve is normal in structure. Trivial mitral valve regurgitation. No evidence of mitral stenosis.  4. The aortic valve is normal  in structure. Aortic valve regurgitation is not visualized. No aortic stenosis is present.  5. The inferior vena cava is normal in size with greater than 50% respiratory variability, suggesting right atrial pressure of 3 mmHg. FINDINGS  Left Ventricle: Left ventricular ejection fraction, by estimation, is 60 to 65%. The left ventricle has normal function. The left ventricle has no regional wall motion abnormalities. The left ventricular internal cavity size was normal in size. There is  no left ventricular hypertrophy. Left ventricular diastolic parameters were normal. Right Ventricle: The right ventricular size is normal. No increase in right ventricular wall thickness. Right ventricular systolic function is normal. Tricuspid regurgitation signal is inadequate for assessing PA pressure. Left Atrium: Left atrial size was normal in size. Right Atrium: Right atrial size was normal in size. Pericardium: There is no evidence of pericardial effusion. Mitral Valve: The mitral valve is normal in structure. Trivial mitral valve regurgitation. No evidence of mitral valve stenosis. Tricuspid Valve: The tricuspid  valve is normal in structure. Tricuspid valve regurgitation is trivial. No evidence of tricuspid stenosis. Aortic Valve: The aortic valve is normal in structure. Aortic valve regurgitation is not visualized. No aortic stenosis is present. Pulmonic Valve: The pulmonic valve was normal in structure. Pulmonic valve regurgitation is not visualized. No evidence of pulmonic stenosis. Aorta: The aortic root is normal in size and structure. Venous: The inferior vena cava is normal in size with greater than 50% respiratory variability, suggesting right atrial pressure of 3 mmHg. IAS/Shunts: No atrial level shunt detected by color flow Doppler.  LEFT VENTRICLE PLAX 2D LVIDd:         3.70 cm     Diastology LVIDs:         1.88 cm     LV e' medial:    7.62 cm/s LV PW:         0.90 cm     LV E/e' medial:  8.9 LV IVS:        0.90 cm     LV e' lateral:   11.60 cm/s LVOT diam:     2.00 cm     LV E/e' lateral: 5.9 LV SV:         66 LV SV Index:   40 LVOT Area:     3.14 cm  LV Volumes (MOD) LV vol d, MOD A2C: 55.1 ml LV vol d, MOD A4C: 73.4 ml LV vol s, MOD A2C: 27.4 ml LV vol s, MOD A4C: 40.8 ml LV SV MOD A2C:     27.7 ml LV SV MOD A4C:     73.4 ml LV SV MOD BP:      29.4 ml RIGHT VENTRICLE             IVC RV S prime:     10.30 cm/s  IVC diam: 1.80 cm TAPSE (M-mode): 1.4 cm LEFT ATRIUM             Index        RIGHT ATRIUM          Index LA diam:        3.50 cm 2.14 cm/m   RA Area:     8.94 cm LA Vol (A2C):   18.8 ml 11.49 ml/m  RA Volume:   14.20 ml 8.68 ml/m LA Vol (A4C):   25.8 ml 15.77 ml/m LA Biplane Vol: 22.2 ml 13.57 ml/m  AORTIC VALVE LVOT Vmax:   92.60 cm/s LVOT Vmean:  64.000 cm/s LVOT VTI:    0.209  m  AORTA Ao Root diam: 2.90 cm Ao Asc diam:  3.10 cm MITRAL VALVE MV Area (PHT): 3.19 cm    SHUNTS MV Decel Time: 238 msec    Systemic VTI:  0.21 m MV E velocity: 68.10 cm/s  Systemic Diam: 2.00 cm MV A velocity: 85.40 cm/s MV E/A ratio:  0.80 Mihai Croitoru MD Electronically signed by Thurmon Fair MD Signature  Date/Time: 02/08/2022/10:49:39 AM    Final    CT ANGIO HEAD NECK W WO CM  Result Date: 02/08/2022 CLINICAL DATA:  Facial numbness EXAM: CT ANGIOGRAPHY HEAD AND NECK TECHNIQUE: Multidetector CT imaging of the head and neck was performed using the standard protocol during bolus administration of intravenous contrast. Multiplanar CT image reconstructions and MIPs were obtained to evaluate the vascular anatomy. Carotid stenosis measurements (when applicable) are obtained utilizing NASCET criteria, using the distal internal carotid diameter as the denominator. RADIATION DOSE REDUCTION: This exam was performed according to the departmental dose-optimization program which includes automated exposure control, adjustment of the mA and/or kV according to patient size and/or use of iterative reconstruction technique. CONTRAST:  26mL OMNIPAQUE IOHEXOL 350 MG/ML SOLN COMPARISON:  None Available. FINDINGS: CTA NECK FINDINGS SKELETON: There is no bony spinal canal stenosis. No lytic or blastic lesion. OTHER NECK: Normal pharynx, larynx and major salivary glands. No cervical lymphadenopathy. Unremarkable thyroid gland. UPPER CHEST: No pneumothorax or pleural effusion. No nodules or masses. AORTIC ARCH: There is no calcific atherosclerosis of the aortic arch. There is no aneurysm, dissection or hemodynamically significant stenosis of the visualized portion of the aorta. Conventional 3 vessel aortic branching pattern. The visualized proximal subclavian arteries are widely patent. RIGHT CAROTID SYSTEM: Normal without aneurysm, dissection or stenosis. LEFT CAROTID SYSTEM: Normal without aneurysm, dissection or stenosis. VERTEBRAL ARTERIES: Left dominant configuration. Both origins are clearly patent. There is no dissection, occlusion or flow-limiting stenosis to the skull base (V1-V3 segments). CTA HEAD FINDINGS POSTERIOR CIRCULATION: --Vertebral arteries: Normal V4 segments. --Inferior cerebellar arteries: Normal. --Basilar artery:  Normal. --Superior cerebellar arteries: Normal. --Posterior cerebral arteries (PCA): Normal. ANTERIOR CIRCULATION: --Intracranial internal carotid arteries: Atherosclerotic calcification of the internal carotid arteries at the skull base without hemodynamically significant stenosis. --Anterior cerebral arteries (ACA): Normal. Both A1 segments are present. Patent anterior communicating artery (a-comm). --Middle cerebral arteries (MCA): Normal. VENOUS SINUSES: As permitted by contrast timing, patent. ANATOMIC VARIANTS: None Review of the MIP images confirms the above findings. IMPRESSION: No emergent large vessel occlusion or hemodynamically significant stenosis of the head or neck. Electronically Signed   By: Deatra Robinson M.D.   On: 02/08/2022 00:49   MR BRAIN WO CONTRAST  Result Date: 02/08/2022 CLINICAL DATA:  Acute neurologic deficit EXAM: MRI HEAD WITHOUT CONTRAST TECHNIQUE: Multiplanar, multiecho pulse sequences of the brain and surrounding structures were obtained without intravenous contrast. COMPARISON:  None Available. 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 No acute or chronic hemorrhage. Normal white matter signal, parenchymal volume and CSF spaces. The midline structures are normal. Vascular: Major flow voids are preserved. Skull and upper cervical spine: Normal calvarium and skull base. Visualized upper cervical spine and soft tissues are normal. Sinuses/Orbits:No paranasal sinus fluid levels or advanced mucosal thickening. No mastoid or middle ear effusion. Normal orbits. 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 No hemorrhage or mass effect. Electronically Signed   By: Deatra Robinson M.D.   On: 02/08/2022 00:04   CT Head Wo Contrast  Result Date: 02/07/2022 CLINICAL DATA:  Acute neurologic deficit EXAM:  CT HEAD WITHOUT CONTRAST TECHNIQUE: Contiguous axial images were obtained from the base of the skull through the vertex without intravenous  contrast. RADIATION DOSE REDUCTION: This exam was performed according to the departmental dose-optimization program which includes automated exposure control, adjustment of the mA and/or kV according to patient size and/or use of iterative reconstruction technique. COMPARISON:  None Available. FINDINGS: Brain: There is no mass, hemorrhage or extra-axial collection. The size and configuration of the ventricles and extra-axial CSF spaces are normal. The brain parenchyma is normal, without acute or chronic infarction. Vascular: No abnormal hyperdensity of the major intracranial arteries or dural venous sinuses. No intracranial atherosclerosis. Skull: The visualized skull base, calvarium and extracranial soft tissues are normal. Sinuses/Orbits: No fluid levels or advanced mucosal thickening of the visualized paranasal sinuses. No mastoid or middle ear effusion. The orbits are normal. IMPRESSION: Normal head CT. Electronically Signed   By: Deatra Robinson M.D.   On: 02/07/2022 19:51    PHYSICAL EXAM Mental Status -  Level of arousal and orientation to time, place, and person were intact. Language including expression, naming, repetition, comprehension was assessed and found intact. Fund of Knowledge was assessed and was intact.   Cranial Nerves II - XII - II - Visual field intact OU. III, IV, VI - Extraocular movements intact. V - Facial sensation intact bilaterally. VII - Facial movement intact bilaterally. VIII - Hearing & vestibular intact bilaterally. X - Palate elevates symmetrically. XI - Chin turning & shoulder shrug intact bilaterally. XII - Tongue protrusion intact.   Motor Strength - The patient's strength was normal in RUE and RLE, LUE, and LLE. Bulk was normal and fasciculations were absent.   Motor Tone - Muscle tone was assessed at the neck and appendages and was normal.   Sensory - Light touch, temperature/pinprick were assessed and were symmetrical. She felt the right lower leg felt  different compared to the left but was not able to elaborate  ASSESSMENT/PLAN [name] is a 56 y.o. female with medical history significant for HTN, HLD who presented to the ED for evaluation of change in speech and left facial numbness.  Stroke:  right BG/CR infarct, likely small vessel disease.  CT head No acute abnormality.  CTA head & neck- no emergent large vessel occlusion or hemodynamically significant stenosis of the head or neck. MRI  Small acute ischemic infarct of the right caudate. No hemorrhage or mass effect. 2D Echo shows EF 60 to 65%  LDL 153 HgbA1c 5.2 VTE prophylaxis - lovenox No antithrombotic prior to admission, now on aspirin 81 mg daily and clopidogrel 75 mg daily DAPT for 3 weeks and then as alone..  Therapy recommendations:  none Disposition:  home  Hypertension Home meds:  amlodipine and lisinopril (patient not taking) Unstable Gradually normalize BP in 2 to 3 days Long-term BP goal normotensive  Hyperlipidemia Home meds:  crestor (not taking) LDL 153, goal < 70 Now on Crestor 40 Continue statin at discharge  Hospital day # 0  Christia Reading, MD PGY-1 Psychiatry  ATTENDING NOTE: I reviewed above note and agree with the assessment and plan. Pt was seen and examined.   56 year old female with history of hypertension, hyperlipidemia admitted for slurred speech, left-sided numbness and left facial droop.  CT no acute abnormality.  CTA head and neck unremarkable.  MRI showed right CR/BG infarct.  EF 60 to 65%, LDL 153, A1c 5.2, UDS pending.  Creatinine 0.79.  BP still elevated.  On exam, patient awake alert, orientated  x3, no aphasia, follows simple commands.  Neurologic intact except left lower extremity mild sensation loss.  Etiology for patient stroke likely small vessel disease, recommend aspirin 81 and Plavix 75 DAPT for 3 weeks and then aspirin alone.  Put on Crestor 40.  Gradual normalize BP in 2 to 3 days.  PT therapy no recommendation.  Risk  factor modification.  Patient interested in [place] stroke study, will give her more information to read and decide.  For detailed assessment and plan, please refer to above/below as I have made changes wherever appropriate.   Marvel Plan, MD PhD Stroke Neurology 02/08/2022 7:19 PM   To contact Stroke Continuity provider, please refer to [email or web address]. After hours, contact General Neurology

## 2022-02-08 NOTE — Plan of Care (Signed)
                                      [facility]                            [address] [place], [place] [address]      [name] was admitted to the Hospital on 02/07/2022 and Discharged  02/08/2022 and should be excused from work/school   for 5 days starting from date -  02/07/2022 , may return to work/school without any restrictions.  Call Lala Lund MD, [facility]  [phone] with questions.  Lala Lund M.D on 02/08/2022,at 3:42 PM  [facility]   Office  [phone]

## 2022-02-08 NOTE — Evaluation (Signed)
Speech Language Pathology Evaluation Patient Details Name: [name] MRN: [id number] DOB: 1965-10-09 Today's Date: 02/08/2022 Time: 0102-7253 SLP Time Calculation (min) (ACUTE ONLY): 15 min  Problem List:  Patient Active Problem List   Diagnosis Date Noted   Left facial numbness 02/07/2022   Elevated TSH 08/12/2016   Hypertension    Hyperlipidemia    Anxiety    Depression    Past Medical History:  Past Medical History:  Diagnosis Date   Anxiety    Depression    Hyperlipidemia    Hypertension    Subclinical hypothyroidism    Past Surgical History:  Past Surgical History:  Procedure Laterality Date   SHOULDER SURGERY     TUBAL LIGATION     HPI:  [name] is a 56 y/o female who presented with slurred speech and L facial numbness. Pt was hypertensive on arrival. MRI brain showed small ischemic infarct of R caudate. PMH: HTN, HLD.   Assessment / Plan / Recommendation Clinical Impression  Pt presents with normal cognition, scoring WNL in areas of attention, memory, problem-solving, and awareness. Speech is clear without dysarthria. She is a good historian; language is intact.  We reviewed the BE-FAST acronym and results from her exam. No deficits identified; no f/u is needed. Our service will sign off.    SLP Assessment  SLP Recommendation/Assessment: Patient does not need any further Speech [facility] SLP Visit Diagnosis: Cognitive communication deficit (R41.841)    Recommendations for follow up therapy are one component of a multi-disciplinary discharge planning process, led by the attending physician.  Recommendations may be updated based on patient status, additional functional criteria and insurance authorization.    Follow Up Recommendations  No SLP follow up                       SLP Evaluation Cognition  Overall Cognitive Status: Within Functional Limits for tasks assessed Arousal/Alertness: Awake/alert Orientation Level: Oriented  X4 Attention: Alternating Alternating Attention: Appears intact Memory: Appears intact Awareness: Appears intact Problem Solving: Appears intact Safety/Judgment: Appears intact       Comprehension  Auditory Comprehension Overall Auditory Comprehension: Appears within functional limits for tasks assessed Reading Comprehension Reading Status: Within funtional limits    Expression Expression Primary Mode of Expression: Verbal Verbal Expression Overall Verbal Expression: Appears within functional limits for tasks assessed Written Expression Dominant Hand: Right   Oral / Motor  Oral Motor/Sensory Function Overall Oral Motor/Sensory Function: Within functional limits Motor Speech Overall Motor Speech: Appears within functional limits for tasks assessed            Juan Quam Laurice 02/08/2022, 11:55 AM  Estill Bamberg L. Tivis Ringer, MA CCC/SLP Clinical Specialist - [place] Office number [phone]

## 2022-02-08 NOTE — Research (Signed)
PATIENT: [name] DOB: [birth year]  [name] is a 56 y.o. female who meets inclusion and exclusion criteria and may be a potential candidate for [place] stroke study.  A brief description of the study's purpose and duration was provided to see if the patient was interested in participating. The patient was told that study participation is voluntary, and if she choose not to participate, she will continue receiving the same treatment as any other patient.   Since the patient seemed interested in the trial, a copy of the informed consent was provided to her at  11:33 am on 02/08/2022 and I said I would come back later to discuss the study in more detail after she had time to read the informed consent.  Detailed discussion  I returned at 2:19pm on 02/08/2022 to discuss the study in detail.  I requested they ask any questions she may have as we reviewed the informed consent so I could answer them as we went along.I discussed the standard-of-care treatments, appropriate alternative treatments, procedures or devices that might be helpful to the patients, the clinical trial, and each option's relative risks and benefits. We discussed the research participant's bill of rights. Then, we also reviewed the ICF page by page and discussed their responsibilities if they choose to participate, any compensation, and what medical treatment is available if injury occurs. KASI LASKY was encouraged to discuss study participation with family, significant others, and primary care attending or physician to help reduce the possibility of undue influence by talking to the investigator alone.  The patient was reminded that the study participation is voluntary, and if she choose not to participate, she will continue receiving the same care as any other patient. Additionally, if she choose to participate and later decide they want to withdraw, she will continue to be treated as any other patient. AYDEN APODACA was told  she did not have to decide now.  [name] answered questions appropriately to verbalize understanding of the informed consent.   The subject agreed to participate in the [place] trial and signed the Research Participants [employer] of Rights and the informed consent at 2:45pm on 02/08/2022.  The informed consent was obtained prior to performance of any protocol-specific procedures for the subject.  A copy of the Research Participants [employer] of Rights and signed informed consent was given to the subject, and a copy placed in the subject's medical record.    Will go ahead with randomization and blood drawn at this time.   Rosalin Hawking, MD PhD Stroke Neurology 02/08/2022 3:31 PM

## 2022-02-08 NOTE — ED Notes (Signed)
Pt in room A&O x4 and amb with PT. Denies any needs at this time. Updated on plan of care.

## 2022-02-08 NOTE — Progress Notes (Signed)
PT Cancellation Note  Patient Details Name: [name] MRN: [id number] DOB: 1966/03/16   Cancelled Treatment:    Reason Eval/Treat Not Completed: PT screened, no needs identified, will sign off.  Pt is independent with mobility and will sign off.     Ramond Dial 02/08/2022, 10:02 AM  Mee Hives, PT PhD Acute Rehab Dept. Number: [place] and [place]

## 2022-02-08 NOTE — Consult Note (Signed)
Neurology Consultation Reason for Consult: Left-sided weakness Referring Physician: Rubin Payor, N  CC: Left-sided weakness  History is obtained from: Patient  HPI: [name] is a 56 y.o. female who was in her normal state of health when she went to bed last night.  When she awoke, she was slurring her speech.  She noticed some numbness on her left side.  When she was speaking with family, they noticed left-sided facial droop and therefore finally convinced her to come into the emergency department for evaluation.  She has a history of hyperlipidemia, but has been intolerant to statins and is currently supposed to be managed on Crestor 10 mg daily, though she does not take it due to cost.  She is not currently taking any medications including aspirin.  LKW: 10/1 prior to bed tpa given?: no, outside of window  Past Medical History:  Diagnosis Date   Anxiety    Depression    Hyperlipidemia    Hypertension    Subclinical hypothyroidism      Family History  Problem Relation Age of Onset   Healthy Mother    Heart disease Father      Social History:  reports that she has never smoked. She has never used smokeless tobacco. She reports that she does not drink alcohol and does not use drugs.   Exam: Current vital signs: BP 137/83 (BP Location: Right Arm)   Pulse 100   Temp 98.4 F (36.9 C) (Oral)   Resp 18   Ht 5\' 3"  (1.6 m)   Wt 61.2 kg   LMP 11/16/2010   SpO2 100%   BMI 23.91 kg/m  Vital signs in last 24 hours: Temp:  [97.6 F (36.4 C)-98.4 F (36.9 C)] 98.4 F (36.9 C) (10/03 0208) Pulse Rate:  [54-102] 100 (10/03 0208) Resp:  [18-22] 18 (10/03 0208) BP: (137-248)/(83-141) 137/83 (10/03 0208) SpO2:  [99 %-100 %] 100 % (10/03 0208) Weight:  [61.2 kg] 61.2 kg (10/02 1749)   Physical Exam  Constitutional: Appears well-developed and well-nourished.  Psych: Affect appropriate to situation Eyes: No scleral injection HENT: No OP obstruction MSK: no joint  deformities.  Cardiovascular: Normal rate and regular rhythm.  Respiratory: Effort normal, non-labored breathing GI: Soft.  No distension. There is no tenderness.  Skin: WDI  Neuro: Mental Status: Patient is awake, alert, oriented to person, place, month, year, and situation. Patient is able to give a clear and coherent history. No signs of aphasia or neglect Cranial Nerves: II: Visual Fields are full. Pupils are equal, round, and reactive to light.   III,IV, VI: EOMI without ptosis or diploplia.  V: Facial sensation is symmetric to temperature VII: Facial movement with mild left facial weakness VIII: hearing is intact to voice X: Uvula elevates symmetrically XI: Shoulder shrug is symmetric. XII: tongue is midline without atrophy or fasciculations.  Motor: Tone is normal. Bulk is normal. 5/5 strength was present on the right, she has mild left arm and leg weakness with minimal drift in the left upper extremity Sensory: Sensation is symmetric to light touch and temperature in the arms and legs. Cerebellar: Finger-nose-finger intact on the right, consistent with weakness on the left     I have reviewed labs in epic and the results pertinent to this consultation are: Creatinine 0.8  I have reviewed the images obtained: CT head-negative  Impression: 56 year old female with what I suspect to be a small subcortical infarct on the right.  She will need further work-up with MRI to confirm  this, as well as to focus on physical therapy and secondary risk factor modification.  Recommendations: - HgbA1c, fasting lipid panel - MRI of the brain without contrast - Frequent neuro checks - Echocardiogram - CTA head and neck - Prophylactic therapy-Antiplatelet med: Aspirin - dose 81mg  and plavix 75mg  daily  after 300mg  load  - Risk factor modification - Telemetry monitoring - PT consult, OT consult, Speech consult - Stroke team to follow -Consider social work consult to help with  medication affordability.    Roland Rack, MD [facility] [phone]  If 7pm- 7am, please page neurology on call as listed in [place].

## 2022-02-08 NOTE — Evaluation (Signed)
Occupational Therapy Evaluation/Discharge Patient Details Name: [name] MRN: [id number] DOB: 12-07-1965 Today's Date: 02/08/2022   History of Present Illness Pt is a 56 y/o female who presented with slurred speech and L facial numbness. PT hypertensive on arrival. MRI brain showed small ischemic infarct of R caudate. PMH: HTN, HLD   Clinical Impression   PTA, pt lives with family, typically Independent in all daily tasks including work in [employer]. Pt presents now Independent with ADLs/mobility without safety concerns. Pt's strength, coordination and sensation overall WFL and symmetrical (minor limitations with L elbow IV placement). Pt denies dizziness with mobility or vision changes. Educated re: gradual return to daily tasks, monitoring of BP at home, and other stroke symptoms to watch out for. Pt verbalized understanding with no skilled therapy needs at this time. OT to sign off  BP pre activity: 170s/70s BP post activity: 170s/90s      Recommendations for follow up therapy are one component of a multi-disciplinary discharge planning process, led by the attending physician.  Recommendations may be updated based on patient status, additional functional criteria and insurance authorization.   Follow Up Recommendations  No OT follow up    Assistance Recommended at Discharge None  Patient can return home with the following      Functional Status Assessment  Patient has not had a recent decline in their functional status  Equipment Recommendations  None recommended by OT    Recommendations for Other Services       Precautions / Restrictions Precautions Precautions: None Restrictions Weight Bearing Restrictions: No      Mobility Bed Mobility Overal bed mobility: Independent                  Transfers Overall transfer level: Independent Equipment used: None                      Balance Overall balance assessment: Independent                                          ADL either performed or assessed with clinical judgement   ADL Overall ADL's : Independent                                       General ADL Comments: able to manage socks, mobilize unit without AD, stand for UB ADLs w/o issues.     Vision Baseline Vision/History: 0 No visual deficits;1 Wears glasses Ability to See in Adequate Light: 0 Adequate Patient Visual Report: No change from baseline Vision Assessment?: No apparent visual deficits     Perception     Praxis      Pertinent Vitals/Pain Pain Assessment Pain Assessment: No/denies pain     Hand Dominance Right   Extremity/Trunk Assessment Upper Extremity Assessment Upper Extremity Assessment: Overall WFL for tasks assessed (strength, coordination and sensation WFL and symmetrical. mild weakness in L tricep in comparison to R tricep but pt citing L elbow iV pain)   Lower Extremity Assessment Lower Extremity Assessment: Overall WFL for tasks assessed   Cervical / Trunk Assessment Cervical / Trunk Assessment: Normal   Communication Communication Communication: No difficulties   Cognition Arousal/Alertness: Awake/alert Behavior During Therapy: WFL for tasks assessed/performed Overall Cognitive Status: Within Functional Limits for tasks assessed  General Comments  Discussed BP monitoring at home, average BP readings, checking BP prior to taking meds    Exercises     Shoulder Instructions      Home Living Family/patient expects to be discharged to:: Private residence Living Arrangements: Spouse/significant other;Children;Other relatives (38 y/o granddaughter) Available Help at Discharge: Family Type of Home: House Home Access: Stairs to enter Technical brewer of Steps: 2 Entrance Stairs-Rails: Right;Left Home Layout: One level     Bathroom Shower/Tub: Engineer, site: Standard     Home Equipment: None          Prior Functioning/Environment Prior Level of Function : Independent/Modified Independent;Working/employed;Driving             Mobility Comments: no use of AD prior ADLs Comments: Works in Production designer, theatre/television/film        OT Problem List:        OT Treatment/Interventions:      OT Goals(Current goals can be found in the care plan section) Acute Rehab OT Goals Patient Stated Goal: go home soon, get some breakfast OT Goal Formulation: All assessment and education complete, DC therapy  OT Frequency:      Co-evaluation              AM-PAC OT "6 Clicks" Daily Activity     Outcome Measure Help from another person eating meals?: None Help from another person taking care of personal grooming?: None Help from another person toileting, which includes using toliet, bedpan, or urinal?: None Help from another person bathing (including washing, rinsing, drying)?: None Help from another person to put on and taking off regular upper body clothing?: None Help from another person to put on and taking off regular lower body clothing?: None 6 Click Score: 24   End of Session Nurse Communication: Mobility status  Activity Tolerance: Patient tolerated treatment well Patient left: in bed;with call bell/phone within reach  OT Visit Diagnosis: Muscle weakness (generalized) (M62.81)                Time: 1443-1540 OT Time Calculation (min): 26 min Charges:  OT General Charges $OT Visit: 1 Visit OT Evaluation $OT Eval Low Complexity: 1 Low  Malachy Chamber, OTR/L Acute Rehab Services Office: [phone]   Layla Maw 02/08/2022, 8:57 AM

## 2022-02-28 NOTE — Progress Notes (Signed)
[facility] [address] [place].  [address] [phone]       HOSPITAL FOLLOW UP NOTE  [name] Date of Birth:  1965/12/05 Medical Record Number:  [id number]   Reason for Referral:  hospital stroke follow up    SUBJECTIVE:   CHIEF COMPLAINT:  Chief Complaint  Patient presents with   Hospitalization Follow-up stroke    Pt is feeling well. She states gets tired easily. No other questions or concerns. Room 2 alone    HPI:   [name] is a 56 y.o. female with medical history significant for HTN, HLD who presented to the ED on 02/07/2022 for evaluation of change in speech and left facial numbness. Evaluated by Dr. Roda Shutters for right BG/CR infarct, likely secondary to small vessel disease. CTA head/neck unremarkable. EF 60-65%. LDL 153, A1c 5.2. recommended DAPT for 3 weeks then aspirin alone as well as restarting Crestor as previously noncompliant.  She was also enrolled in [place] trial with [facility] research on [place]vex[place]/placebo trial medication.  Per neuro exam, largely intact except LLE mild sensation loss.  Evaluated by therapies and discharged home without therapy needs.    Today, 03/01/2022, patient is being seen for initial hospital follow-up unaccompanied.  She has been doing well since discharge without any new or reoccurring stroke/TIA symptoms.  She does complain of feeling easily fatigued. She returned back to work yesterday, works at [employer] in [employer]. Did have some fatigue prior to her stroke as well as insomnia, and snoring. Was recently started on trazodone by PCP.  She has not previously underwent sleep study.  Will complete 3 weeks DAPT in 2 days and then aspirin alone as well as remains on Crestor, denies side effects.  She is also enrolled in [place] trial, followed by [facility] research.  Blood pressure well controlled. Closely followed by PCP, has f/u end of November with plans on repeat lab work.  No further concerns at this  time      PERTINENT IMAGING  Per hospitalization 02/07/2022 -02/08/2022 CT head No acute abnormality.  CTA head & neck- no emergent large vessel occlusion or hemodynamically significant stenosis of the head or neck. MRI  Small acute ischemic infarct of the right caudate. No hemorrhage or mass effect. 2D Echo shows EF 60 to 65%  LDL 153 HgbA1c 5.2    ROS:   14 system review of systems performed and negative with exception of those listed in HPI  PMH:  Past Medical History:  Diagnosis Date   Anxiety    Depression    Hyperlipidemia    Hypertension    Subclinical hypothyroidism     PSH:  Past Surgical History:  Procedure Laterality Date   SHOULDER SURGERY     TUBAL LIGATION      Social History:  Social History   Socioeconomic History   Marital status: Single    Spouse name: Not on file   Number of children: Not on file   Years of education: Not on file   Highest education level: Not on file  Occupational History   Not on file  Tobacco Use   Smoking status: Never   Smokeless tobacco: Never  Vaping Use   Vaping Use: Never used  Substance and Sexual Activity   Alcohol use: No   Drug use: No   Sexual activity: Not on file  Other Topics Concern   Not on file  Social History Narrative   Not on file   Social Determinants  of Health   Financial Resource Strain: Not on file  Food Insecurity: Not on file  Transportation Needs: Not on file  Physical Activity: Not on file  Stress: Not on file  Social Connections: Not on file  Intimate Partner Violence: Not on file    Family History:  Family History  Problem Relation Age of Onset   Healthy Mother    Heart disease Father     Medications:   Current Outpatient Medications on File Prior to Visit  Medication Sig Dispense Refill   amLODipine (NORVASC) 5 MG tablet Take 1 tablet (5 mg total) by mouth daily. 30 tablet 0   aspirin EC 81 MG tablet Take 1 tablet (81 mg total) by mouth daily. Swallow whole. 30  tablet 12   clopidogrel (PLAVIX) 75 MG tablet Take 1 tablet (75 mg total) by mouth daily. 20 tablet 0   fluticasone (FLONASE) 50 MCG/ACT nasal spray Place 1 spray into both nostrils daily. 16 g 2   lisinopril (ZESTRIL) 5 MG tablet Take 5 mg by mouth daily.     rosuvastatin (CRESTOR) 40 MG tablet Take 1 tablet (40 mg total) by mouth daily. 30 tablet 0   traZODone (DESYREL) 50 MG tablet Take 50 mg by mouth at bedtime.     [DISCONTINUED] linaclotide (LINZESS) 145 MCG CAPS capsule Take 145 mcg by mouth daily before breakfast.     No current facility-administered medications on file prior to visit.    Allergies:   Allergies  Allergen Reactions   Lipitor [Atorvastatin] Other (See Comments)    Myalgias      OBJECTIVE:  Physical Exam  Vitals:   03/01/22 0916  BP: 137/81  Pulse: 71  Weight: 126 lb 6 oz (57.3 kg)  Height: 5\' 3"  (1.6 m)   Body mass index is 22.39 kg/m. No results found.  Poststroke PHQ 2/9    03/01/2022    9:47 AM  Depression screen PHQ 2/9  Decreased Interest 0  Down, Depressed, Hopeless 0  PHQ - 2 Score 0     General: well developed, well nourished, very pleasant middle-age Caucasian female, seated, in no evident distress Head: head normocephalic and atraumatic.   Neck: supple with no carotid or supraclavicular bruits Cardiovascular: regular rate and rhythm, no murmurs Musculoskeletal: no deformity Skin:  no rash/petichiae Vascular:  Normal pulses all extremities   Neurologic Exam Mental Status: Awake and fully alert.  Fluent speech and language.  Oriented to place and time. Recent and remote memory intact. Attention span, concentration and fund of knowledge appropriate. Mood and affect appropriate.  Cranial Nerves: Fundoscopic exam reveals sharp disc margins. Pupils equal, briskly reactive to light. Extraocular movements full without nystagmus. Visual fields full to confrontation. Hearing intact. Facial sensation intact. Face, tongue, palate moves  normally and symmetrically.  Motor: Normal bulk and tone. Normal strength in all tested extremity muscles Sensory.: intact to touch , pinprick , position and vibratory sensation.  Coordination: Rapid alternating movements normal in all extremities. Finger-to-nose and heel-to-shin performed accurately bilaterally. Gait and Station: Arises from chair without difficulty. Stance is normal. Gait demonstrates normal stride length and balance without use of AD. Tandem walk and heel toe without difficulty.  Reflexes: 1+ and symmetric. Toes downgoing.     NIHSS  0 Modified Rankin  0      ASSESSMENT: [name] is a 56 y.o. 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 Vascular risk factors include HTN, HLD and medication  noncompliance.      PLAN:  Right BG/CR stroke:  Recovered well without residual deficit.  Does have some increased fatigue compared to baseline since her stroke, discussed typical recovery time and this should continue to improve over time.  Continue aspirin 81mg  daily and rosuvastatin (Crestor) for secondary stroke prevention.  Will complete plavix in 2 days. Discussed importance of medication compliance Continue to follow with [facility] research for [place] trial Discussed secondary stroke prevention measures and importance of close PCP follow up for aggressive stroke risk factor management including BP goal<130/90 and HLD with LDL goal<70 Stroke labs 02/2022: LDL 153, A1c 5.2 I have gone over the pathophysiology of stroke, warning signs and symptoms, risk factors and their management in some detail with instructions to go to the closest emergency room for symptoms of concern. Suspect sleep apnea: Complains of excessive daytime sleepiness, insomnia and snoring.  Referral placed to [place] sleep clinic to evaluate for possible underlying sleep apnea.    Doing well from stroke standpoint without further recommendations and risk  factors are managed by PCP. She may follow up PRN, as usual for our patients who are strictly being followed for stroke. If any new neurological issues should arise, request PCP place referral for evaluation by one of our neurologists. Thank you.     CC:  [facility] provider: Dr. Leonie Man PCP: Elza Rafter, FNP    I spent 57 minutes of face-to-face and non-face-to-face time with patient.  This included previsit chart review including review of recent hospitalization, lab review, study review, order entry, electronic health record documentation, patient education regarding recent stroke including etiology, secondary stroke prevention measures and importance of managing stroke risk factors, possible underlying sleep apnea and further evaluation and answered all other questions to patient satisfaction   Frann Rider, AGNP-BC  [facility] [address] [place], [place] [address]  Phone [phone] Fax [phone] Note: This document was prepared with digital dictation and possible smart phrase technology. Any transcriptional errors that result from this process are unintentional.

## 2022-03-01 ENCOUNTER — Encounter: Payer: Self-pay | Admitting: Adult Health

## 2022-03-01 ENCOUNTER — Ambulatory Visit (INDEPENDENT_AMBULATORY_CARE_PROVIDER_SITE_OTHER): Payer: Medicaid Other | Admitting: Adult Health

## 2022-03-01 VITALS — BP 137/81 | HR 71 | Ht 63.0 in | Wt 126.4 lb

## 2022-03-01 DIAGNOSIS — Z09 Encounter for follow-up examination after completed treatment for conditions other than malignant neoplasm: Secondary | ICD-10-CM | POA: Diagnosis not present

## 2022-03-01 DIAGNOSIS — I639 Cerebral infarction, unspecified: Secondary | ICD-10-CM | POA: Diagnosis not present

## 2022-03-01 DIAGNOSIS — R29818 Other symptoms and signs involving the nervous system: Secondary | ICD-10-CM

## 2022-03-01 NOTE — Patient Instructions (Addendum)
Continue aspirin 81 mg daily  and Crestor for secondary stroke prevention  You will be referred to our sleep clinic to evaluate for possible sleep apnea, they will call you to schedule   Continue to follow with our research team in [place] trial - please contact them with any questions or concerns about this research trial  Continue to follow up with PCP regarding cholesterol and blood pressure management  Maintain strict control of hypertension with blood pressure goal below 130/90 and cholesterol with LDL cholesterol (bad cholesterol) goal below 70 mg/dL.   Signs of a Stroke? Follow the BEFAST method:  Balance Watch for a sudden loss of balance, trouble with coordination or vertigo Eyes Is there a sudden loss of vision in one or both eyes? Or double vision?  Face: Ask the person to smile. Does one side of the face droop or is it numb?  Arms: Ask the person to raise both arms. Does one arm drift downward? Is there weakness or numbness of a leg? Speech: Ask the person to repeat a simple phrase. Does the speech sound slurred/strange? Is the person confused ? Time: If you observe any of these signs, call [phone].       Thank you for coming to see [place] at [facility]. I hope we have been able to provide you high quality care today.  You may receive a patient satisfaction survey over the next few weeks. We would appreciate your feedback and comments so that we may continue to improve ourselves and the health of our patients.

## 2022-09-25 ENCOUNTER — Emergency Department (HOSPITAL_COMMUNITY)
Admission: EM | Admit: 2022-09-25 | Discharge: 2022-09-26 | Disposition: A | Discharge status: Home / Self Care | Payer: Medicaid Other | Attending: Emergency Medicine | Admitting: Emergency Medicine

## 2022-09-25 ENCOUNTER — Ambulatory Visit (HOSPITAL_COMMUNITY)
Admission: EM | Admit: 2022-09-25 | Discharge: 2022-09-25 | Disposition: A | Discharge status: Home / Self Care | Payer: Medicaid Other

## 2022-09-25 ENCOUNTER — Encounter (HOSPITAL_COMMUNITY): Payer: Self-pay

## 2022-09-25 ENCOUNTER — Emergency Department (HOSPITAL_COMMUNITY): Payer: Medicaid Other

## 2022-09-25 ENCOUNTER — Other Ambulatory Visit: Payer: Self-pay

## 2022-09-25 DIAGNOSIS — R030 Elevated blood-pressure reading, without diagnosis of hypertension: Secondary | ICD-10-CM

## 2022-09-25 DIAGNOSIS — R519 Headache, unspecified: Secondary | ICD-10-CM

## 2022-09-25 DIAGNOSIS — I10 Essential (primary) hypertension: Secondary | ICD-10-CM

## 2022-09-25 DIAGNOSIS — Z8673 Personal history of transient ischemic attack (TIA), and cerebral infarction without residual deficits: Secondary | ICD-10-CM

## 2022-09-25 DIAGNOSIS — R29810 Facial weakness: Secondary | ICD-10-CM | POA: Diagnosis not present

## 2022-09-25 DIAGNOSIS — Z79899 Other long term (current) drug therapy: Secondary | ICD-10-CM | POA: Insufficient documentation

## 2022-09-25 DIAGNOSIS — Z7982 Long term (current) use of aspirin: Secondary | ICD-10-CM | POA: Diagnosis not present

## 2022-09-25 LAB — TROPONIN I (HIGH SENSITIVITY)
Troponin I (High Sensitivity): 3 ng/L (ref <18)
Troponin I (High Sensitivity): 4 ng/L (ref <18)

## 2022-09-25 LAB — BASIC METABOLIC PANEL
Anion gap: 9 (ref 5–15)
BUN: 17 mg/dL (ref 6–20)
CO2: 24 mmol/L (ref 22–32)
Calcium: 8.8 mg/dL — ABNORMAL LOW (ref 8.9–10.3)
Chloride: 105 mmol/L (ref 98–111)
Creatinine, Ser: 0.8 mg/dL (ref 0.44–1.00)
GFR, Estimated: >60 mL/min (ref >60)
Glucose, Bld: 108 mg/dL — ABNORMAL HIGH (ref 70–99)
Potassium: 3.8 mmol/L (ref 3.5–5.1)
Sodium: 138 mmol/L (ref 135–145)

## 2022-09-25 LAB — CBC
HCT: 40.1 % (ref 36.0–46.0)
Hemoglobin: 13.6 g/dL (ref 12.0–15.0)
MCH: 31.1 pg (ref 26.0–34.0)
MCHC: 33.9 g/dL (ref 30.0–36.0)
MCV: 91.6 fL (ref 80.0–100.0)
Platelets: 380 10*3/uL (ref 150–400)
RBC: 4.38 MIL/uL (ref 3.87–5.11)
RDW: 11.6 % (ref 11.5–15.5)
WBC: 8.5 10*3/uL (ref 4.0–10.5)
nRBC: 0 % (ref 0.0–0.2)

## 2022-09-25 MED ORDER — IOHEXOL 350 MG/ML SOLN
75.0000 mL | Freq: Once | INTRAVENOUS | Status: AC | PRN
  Administered 2022-09-25: 75 mL via INTRAVENOUS

## 2022-09-25 MED ORDER — DIPHENHYDRAMINE HCL 50 MG/ML IJ SOLN
12.5000 mg | Freq: Once | INTRAMUSCULAR | Status: AC
  Administered 2022-09-25: 12.5 mg via INTRAVENOUS
  Filled 2022-09-25: qty 1

## 2022-09-25 MED ORDER — PROCHLORPERAZINE EDISYLATE 10 MG/2ML IJ SOLN
10.0000 mg | Freq: Once | INTRAMUSCULAR | Status: AC
  Administered 2022-09-25: 10 mg via INTRAVENOUS
  Filled 2022-09-25: qty 2

## 2022-09-25 MED ORDER — LORAZEPAM 1 MG PO TABS
0.5000 mg | ORAL_TABLET | Freq: Once | ORAL | Status: AC
  Administered 2022-09-25: 0.5 mg via ORAL
  Filled 2022-09-25: qty 1

## 2022-09-25 NOTE — ED Provider Notes (Signed)
[facility]   MRN: [id number] DOB: [birth year]  Subjective:   [name] is a 57 y.o. female presenting for 2-day history of persistent headaches, left sided facial droop.  Patient had a stroke October 2023.  She was advised by her regular doctor to come in and get checked anytime she developed a headache and had high blood pressure.  Patient is very compliant with her medications.  Also feels left-sided facial pain, left-sided tingling of the neck.  No chest pain, diaphoresis, nausea, vomiting, left-sided arm pain.  No current facility-administered medications for this encounter.  Current Outpatient Medications:    amLODipine (NORVASC) 5 MG tablet, Take 1 tablet (5 mg total) by mouth daily., Disp: 30 tablet, Rfl: 0   aspirin EC 81 MG tablet, Take 1 tablet (81 mg total) by mouth daily. Swallow whole., Disp: 30 tablet, Rfl: 12   clopidogrel (PLAVIX) 75 MG tablet, Take 1 tablet (75 mg total) by mouth daily., Disp: 20 tablet, Rfl: 0   fluticasone (FLONASE) 50 MCG/ACT nasal spray, Place 1 spray into both nostrils daily., Disp: 16 g, Rfl: 2   lisinopril (ZESTRIL) 5 MG tablet, Take 5 mg by mouth daily., Disp: , Rfl:    rosuvastatin (CRESTOR) 40 MG tablet, Take 1 tablet (40 mg total) by mouth daily., Disp: 30 tablet, Rfl: 0   traZODone (DESYREL) 50 MG tablet, Take 50 mg by mouth at bedtime., Disp: , Rfl:    Allergies  Allergen Reactions   Lipitor [Atorvastatin] Other (See Comments)    Myalgias    Past Medical History:  Diagnosis Date   Anxiety    Depression    Hyperlipidemia    Hypertension    Subclinical hypothyroidism      Past Surgical History:  Procedure Laterality Date   SHOULDER SURGERY     TUBAL LIGATION      Family History  Problem Relation Age of Onset   Healthy Mother    Heart disease Father     Social History   Tobacco Use   Smoking status: Never   Smokeless tobacco: Never  Vaping Use   Vaping Use: Never used  Substance Use Topics    Alcohol use: No   Drug use: No    ROS   Objective:   Vitals: BP (!) 171/80 (BP Location: Left Arm)   Pulse 75   Temp 98 F (36.7 C) (Oral)   Resp 18   LMP 11/16/2010   SpO2 98%   BP Readings from Last 3 Encounters:  09/25/22 (!) 171/80  03/01/22 137/81  02/08/22 (!) 180/88   BP recheck 150/81.   Physical Exam Constitutional:      General: She is not in acute distress.    Appearance: Normal appearance. She is well-developed. She is not ill-appearing, toxic-appearing or diaphoretic.  HENT:     Head: Normocephalic and atraumatic.     Nose: Nose normal.     Mouth/Throat:     Mouth: Mucous membranes are moist.  Eyes:     General: No scleral icterus.       Right eye: No discharge.        Left eye: No discharge.     Extraocular Movements: Extraocular movements intact.  Cardiovascular:     Rate and Rhythm: Normal rate and regular rhythm.     Heart sounds: Normal heart sounds. No murmur heard.    No friction rub. No gallop.  Pulmonary:     Effort: Pulmonary effort is normal. No respiratory  distress.     Breath sounds: No stridor. No wheezing, rhonchi or rales.  Chest:     Chest wall: No tenderness.  Skin:    General: Skin is warm and dry.  Neurological:     General: No focal deficit present.     Mental Status: She is alert and oriented to person, place, and time.     Cranial Nerves: No cranial nerve deficit.     Motor: No weakness.     Coordination: Coordination normal.     Gait: Gait normal.     Deep Tendon Reflexes: Reflexes normal.     Comments: Slight facial droop to the left side.  Negative Romberg and pronator drift.  Psychiatric:        Mood and Affect: Mood normal.        Behavior: Behavior normal.     Assessment and Plan :   PDMP not reviewed this encounter.  1. Generalized headaches   2. History of stroke   3. Essential hypertension   4. Facial droop    Patient has a diagnosis of a recent stroke from October 2023.  She is symptomatic  currently with elevated blood pressure readings.  Recommended she present to the emergency room as she is in need of higher level of evaluation and intervention than we can provide in the urgent care setting which includes rule out of a recurrent stroke, acute encephalopathy.  Patient presents with her husband who contracts for safety and will take her there now.   Wallis B[place]berg, [place] 09/25/22 [phone]

## 2022-09-25 NOTE — ED Notes (Signed)
Pt going to MRI

## 2022-09-25 NOTE — ED Notes (Signed)
Patient transported to X-ray 

## 2022-09-25 NOTE — ED Provider Notes (Signed)
[employer] EMERGENCY DEPARTMENT AT [facility] Provider Note   CSN: [id number] Arrival date & time: 09/25/22  1746    History  Chief Complaint  Patient presents with   bp issues    [name] is a 57 y.o. female history of prior CVA here for evaluation of elevated blood pressure and headache.  Said headache over the last 2 days,.  She is unsure if she has any vision changes.  No numbness or weakness.  Does have a left facial droop.  History of prior deficits on left from prior CVA.  She does not typically get headaches.  She called her PCP who urged her to be seen for evaluation given her history.  Her blood pressure was significantly elevated today she is compliant with her home meds.  She was seen at urgent care and noted to have left weakness as well as left facial droop and was sent here for further evaluation.  She denies any recent falls or injuries.  Not anticoagulated.  She denies any sudden onset thunderclap headache.  Husband states she does have a slight left facial droop at baseline he is unsure if this is changed from baseline.  Denies any chest pain, shortness of breath.  Does note she has some chronic swelling to her feet which is intermittent in nature, improves with elevation.  No history of CHF, PND, orthopnea. Does have some left lateral neck pain, intermittent. No trauma, weakness. No fever, meningismus.  HPI     Home Medications Prior to Admission medications   Medication Sig Start Date End Date Taking? Authorizing Provider  amLODipine (NORVASC) 10 MG tablet Take 10 mg by mouth daily.   Yes [provider]  aspirin EC 81 MG tablet Take 1 tablet (81 mg total) by mouth daily. Swallow whole. 02/09/22  Yes Leroy Sea, MD  fluticasone (FLONASE) 50 MCG/ACT nasal spray Place 1 spray into both nostrils daily. Patient taking differently: Place 1 spray into both nostrils daily as needed for allergies or rhinitis. 09/08/21  Yes Carlisle Beers, FNP   lisinopril (ZESTRIL) 5 MG tablet Take 5 mg by mouth daily. 02/16/22  Yes [provider]  rosuvastatin (CRESTOR) 40 MG tablet Take 1 tablet (40 mg total) by mouth daily. 02/08/22  Yes Leroy Sea, MD  sertraline (ZOLOFT) 25 MG tablet Take 25 mg by mouth in the morning.   Yes [provider]  traZODone (DESYREL) 50 MG tablet Take 50 mg by mouth at bedtime as needed for sleep.   Yes [provider]  amLODipine (NORVASC) 5 MG tablet Take 1 tablet (5 mg total) by mouth daily. Patient not taking: Reported on 09/25/2022 02/09/22   Leroy Sea, MD  clopidogrel (PLAVIX) 75 MG tablet Take 1 tablet (75 mg total) by mouth daily. Patient not taking: Reported on 09/25/2022 02/09/22   Leroy Sea, MD  linaclotide Karlene Einstein) 145 MCG CAPS capsule Take 145 mcg by mouth daily before breakfast.  10/31/18  [provider]      Allergies    Atorvastatin    Review of Systems   Review of Systems  Constitutional: Negative.   HENT: Negative.    Respiratory: Negative.    Cardiovascular:  Positive for leg swelling. Negative for chest pain and palpitations.  Gastrointestinal: Negative.   Genitourinary: Negative.   Musculoskeletal: Negative.   Skin: Negative.   Neurological:  Positive for facial asymmetry, weakness and headaches. Negative for dizziness, tremors, seizures, syncope, speech difficulty, light-headedness and  numbness.  All other systems reviewed and are negative.   Physical Exam Updated Vital Signs BP (!) 155/91 (BP Location: Left Arm)   Pulse 66   Temp 99.1 F (37.3 C) (Oral)   Resp 17   Ht 5\' 3"  (1.6 m)   Wt 57.3 kg   LMP 11/16/2010   SpO2 95%   BMI 22.38 kg/m  Physical Exam Vitals and nursing note reviewed.  Constitutional:      General: She is not in acute distress.    Appearance: She is well-developed. She is not ill-appearing, toxic-appearing or diaphoretic.  HENT:     Head: Normocephalic and atraumatic.     Nose: Nose normal.      Mouth/Throat:     Mouth: Mucous membranes are moist.  Eyes:     Pupils: Pupils are equal, round, and reactive to light.  Cardiovascular:     Rate and Rhythm: Normal rate.     Pulses: Normal pulses.          Radial pulses are 2+ on the right side and 2+ on the left side.       Dorsalis pedis pulses are 2+ on the right side and 2+ on the left side.     Heart sounds: Normal heart sounds.  Pulmonary:     Effort: Pulmonary effort is normal. No respiratory distress.     Breath sounds: Normal breath sounds.  Abdominal:     General: Bowel sounds are normal. There is no distension.     Palpations: Abdomen is soft.     Tenderness: There is no abdominal tenderness. There is no right CVA tenderness or left CVA tenderness.  Musculoskeletal:        General: No swelling, tenderness, deformity or signs of injury. Normal range of motion.     Cervical back: Normal range of motion.     Right lower leg: No edema.     Left lower leg: No edema.  Skin:    General: Skin is warm and dry.     Capillary Refill: Capillary refill takes less than 2 seconds.  Neurological:     Mental Status: She is alert and oriented to person, place, and time. Mental status is at baseline.     Sensory: No sensory deficit.     Gait: Gait normal.     Comments: Slight left facial droop at mouth, equal sensation face, BIL Upper and lower extremities Slight decrease in handgrip on the left was seen Negative Romberg, heel-to-shin, amatory that ataxic gait  Psychiatric:        Mood and Affect: Mood normal.    ED Results / Procedures / Treatments   Labs (all labs ordered are listed, but only abnormal results are displayed) Labs Reviewed  BASIC METABOLIC PANEL - Abnormal; Notable for the following components:      Result Value   Glucose, Bld 108 (*)    Calcium 8.8 (*)    All other components within normal limits  CBC  TROPONIN I (HIGH SENSITIVITY)  TROPONIN I (HIGH SENSITIVITY)    EKG EKG  Interpretation  Date/Time:  Sunday Sep 25 2022 17:59:49 EDT Ventricular Rate:  77 PR Interval:  146 QRS Duration: 76 QT Interval:  404 QTC Calculation: 457 R Axis:   78 Text Interpretation: Normal sinus rhythm Normal ECG When compared with ECG of 07-Feb-2022 17:36, PREVIOUS ECG IS PRESENT No significant change since last tracing Confirmed by Gwyneth Sprout ([id number]) on 09/25/2022 9:50:27 PM  Radiology CT ANGIO HEAD NECK W  WO CM  Result Date: 09/25/2022 CLINICAL DATA:  Neuro deficit, acute, stroke suspected EXAM: CT ANGIOGRAPHY HEAD AND NECK WITH AND WITHOUT CONTRAST TECHNIQUE: Multidetector CT imaging of the head and neck was performed using the standard protocol during bolus administration of intravenous contrast. Multiplanar CT image reconstructions and MIPs were obtained to evaluate the vascular anatomy. Carotid stenosis measurements (when applicable) are obtained utilizing NASCET criteria, using the distal internal carotid diameter as the denominator. RADIATION DOSE REDUCTION: This exam was performed according to the departmental dose-optimization program which includes automated exposure control, adjustment of the mA and/or kV according to patient size and/or use of iterative reconstruction technique. CONTRAST:  75mL OMNIPAQUE IOHEXOL 350 MG/ML SOLN COMPARISON:  CTA head/neck February 08, 2022. CT head February 07, 2022. FINDINGS: CT HEAD FINDINGS Brain: Small remote infarct in the right frontal white matter. No evidence of acute large vascular territory infarct, acute hemorrhage, mass lesion, midline shift, or hydrocephalus. Vascular: See below. Skull: No acute fracture. Sinuses/Orbits: Moderate paranasal sinus mucosal thickening. No acute orbital findings. Other: No mastoid effusions. Review of the MIP images confirms the above findings CTA NECK FINDINGS Aortic arch: Great vessel origins are patent without significant stenosis. Right carotid system: No evidence of dissection, stenosis (50% or  greater), or occlusion. Left carotid system: No evidence of dissection, stenosis (50% or greater), or occlusion. Vertebral arteries: Left dominant. No evidence of dissection, stenosis (50% or greater), or occlusion. Limited visualization of the proximal right vertebral artery due to high density pooled contrast within adjacent veins. Skeleton: No evidence of acute abnormality on limited assessment. Other neck: No evidence of acute abnormality on limited assessment. Upper chest: Visualized lung apices are clear. Review of the MIP images confirms the above findings CTA HEAD FINDINGS Motion limited study within this limitation: Anterior circulation: Bilateral intracranial ICAs, MCAs and ACAs are patent without proximal hemodynamically significant stenosis. Posterior circulation: Bilateral intradural vertebral arteries, basilar artery and bilateral posterior cerebral arteries are patent without proximal hemodynamically significant stenosis. Venous sinuses: As permitted by contrast timing, patent. Review of the MIP images confirms the above findings IMPRESSION: CT head: 1. No evidence of acute intracranial abnormality. 2. Small remote infarct in the right frontal white matter. CTA: No large vessel occlusion or proximal hemodynamically significant stenosis. Electronically Signed   By: Feliberto Harts M.D.   On: 09/25/2022 20:50   DG Chest 2 View  Result Date: 09/25/2022 CLINICAL DATA:  Hypertension EXAM: CHEST - 2 VIEW COMPARISON:  12/06/2021 FINDINGS: Frontal and lateral views of the chest demonstrate an unremarkable cardiac silhouette. No airspace disease, effusion, or pneumothorax. No acute bony abnormalities. IMPRESSION: 1. No acute intrathoracic process. Electronically Signed   By: Sharlet Salina M.D.   On: 09/25/2022 18:47    Procedures Procedures    Medications Ordered in ED Medications  iohexol (OMNIPAQUE) 350 MG/ML injection 75 mL (75 mLs Intravenous Contrast Given 09/25/22 2028)  prochlorperazine  (COMPAZINE) injection 10 mg (10 mg Intravenous Given 09/25/22 2115)  diphenhydrAMINE (BENADRYL) injection 12.5 mg (12.5 mg Intravenous Given 09/25/22 2115)  LORazepam (ATIVAN) tablet 0.5 mg (0.5 mg Oral Given 09/25/22 2305)    ED Course/ Medical Decision Making/ A&P   58 year old here for evaluation of elevated blood pressure and headache.  Prior CVA has some left-sided deficits at baseline.  Family unsure if current facial droop is worse then normal.  2 days ago, no sudden onset thunderclap headache.  Has noted some intermittent left lateral neck pain however no neck stiffness or neck rigidity.  No  current pain.  She is afebrile, without meningismus, with low suspicion for meningitis based off history and exam.  Has slight decrease in left hand grip.  Ambulatory that ataxic gait.  Seen by urgent care, concern for CVA sent here for further evaluation.  Labs and imaging personally viewed and interpreted:  BC without leukocytosis 9 metabolic panel without significant O'Malley Troponin neg x2 EKG without ischemic changes CTA head, neck without significant findings, remote infarct on right  Discussed results with patient.  Requesting medication for headache.  Will give migraine cocktail.  Will get MRI given unclear of current deficits?  Discussed plan with patient.  Headache improved with migraine cocktail.  MRI pending  Care transferred to [place], [place] who will follow-up on MRI.  Anticipate if negative for acute CVA DC home with outpatient management                              Medical Decision Making Amount and/or Complexity of Data Reviewed Independent Historian: spouse External Data Reviewed: labs, radiology, ECG and notes. Labs: ordered. Decision-making details documented in ED Course. Radiology: ordered and independent interpretation performed. Decision-making details documented in ED Course. ECG/medicine tests: ordered and independent interpretation performed. Decision-making  details documented in ED Course.  Risk OTC drugs. Prescription drug management. Parenteral controlled substances. Decision regarding hospitalization.          Final Clinical Impression(s) / ED Diagnoses Final diagnoses:  Acute nonintractable headache, unspecified headache type  Elevated blood pressure reading    Rx / DC Orders ED Discharge Orders     None         Odessie Polzin A, PA-C 09/25/22 2320    Gwyneth Sprout, MD 09/29/22 1635

## 2022-09-25 NOTE — ED Triage Notes (Addendum)
Pt sent by UC for bp issues. Pt states her bp was 191/101 at 1030 today.  Pt denies HA or vision issues

## 2022-09-25 NOTE — ED Notes (Signed)
Pt returned from MRI °

## 2022-09-25 NOTE — ED Triage Notes (Signed)
Pt is here with 2 day history elevated blood pressure. Pt stated she had a stroke several months ago. Pt does not appear in any distress.

## 2022-09-25 NOTE — ED Notes (Signed)
Called MRI reference this patient, they advised that they did not see an RN listed on chart to call to ask questions about patient. I advised that I was in place of RN, and they advised it would be approximately one hour for them to get to her.

## 2022-09-25 NOTE — Discharge Instructions (Signed)
It was a pleasure taking care of you here in the emergency department  Your workup was reassuring without any evidence of new stroke.  Make sure to follow-up with your primary care provider for further management of your blood pressure  Return for any new or worsening symptoms

## 2022-09-26 NOTE — ED Provider Notes (Signed)
Patient signed out to me pending MRI brain.  MRI shows no new stroke, though it was technically motion degraded.  I discussed this with the patient.  She feels reassured.  She states that she is feeling much better.  I've advised her to follow-up with her neurologist.  She is agreeable with this plan.   Roxy Horseman, PA-C 09/26/22 [id number]    Gilda Crease, MD 09/26/22 [phone]

## 2023-01-21 ENCOUNTER — Encounter (HOSPITAL_COMMUNITY): Payer: Self-pay | Admitting: Emergency Medicine

## 2023-01-21 ENCOUNTER — Ambulatory Visit (HOSPITAL_COMMUNITY)
Admission: EM | Admit: 2023-01-21 | Discharge: 2023-01-21 | Disposition: A | Discharge status: Home / Self Care | Payer: Medicaid Other | Attending: Internal Medicine | Admitting: Internal Medicine

## 2023-01-21 DIAGNOSIS — Z1152 Encounter for screening for COVID-19: Secondary | ICD-10-CM | POA: Insufficient documentation

## 2023-01-21 DIAGNOSIS — Z20822 Contact with and (suspected) exposure to covid-19: Secondary | ICD-10-CM | POA: Diagnosis present

## 2023-01-21 NOTE — Discharge Instructions (Signed)
COVID testing is pending, staff will call you if this is positive. Wear a mask for 5 days of symptoms while you are in public, then you may remove your mask. You may go back to work if you do not have a fever for 24 hours without any medicines.  If you develop any new or worsening symptoms or if your symptoms do not start to improve, please return here or follow-up with your primary care provider. If your symptoms are severe, please go to the emergency room.

## 2023-01-21 NOTE — ED Triage Notes (Signed)
Pt reports her daughter has covid and requesting testing. Denies any s/s of covid. Reports didn't go to work today due to exposure.

## 2023-01-21 NOTE — ED Provider Notes (Signed)
[facility]    CSN: [id number] Arrival date & time: 01/21/23  1221      History   Chief Complaint Chief Complaint  Patient presents with   Covid Exposure    HPI [name] is a 57 y.o. female.   [name] is a 57 y.o. female presenting for chief complaint of Covid Exposure. She is requesting COVID testing today as her daughter has COVID-19 and she has been in close contact. Reports slight cough but this is normal for her. No nasal congestion, headache, dizziness, fatigue, body aches, sore throat, fever/chills. No history of chronic respiratory problems. Never smoker, denies drug use. No other known sick contacts or concerns.     Past Medical History:  Diagnosis Date   Anxiety    Depression    Hyperlipidemia    Hypertension    Subclinical hypothyroidism     Patient Active Problem List   Diagnosis Date Noted   Left facial numbness 02/07/2022   Elevated TSH 08/12/2016   Hypertension    Hyperlipidemia    Anxiety    Depression     Past Surgical History:  Procedure Laterality Date   SHOULDER SURGERY     TUBAL LIGATION      OB History   No obstetric history on file.      Home Medications    Prior to Admission medications   Medication Sig Start Date End Date Taking? Authorizing Provider  losartan (COZAAR) 25 MG tablet Take 1 tablet by mouth daily. 12/07/22 12/07/23 Yes [provider]  amLODipine (NORVASC) 10 MG tablet Take 10 mg by mouth daily.    [provider]  amLODipine (NORVASC) 5 MG tablet Take 1 tablet (5 mg total) by mouth daily. Patient not taking: Reported on 09/25/2022 02/09/22   Leroy Sea, MD  aspirin EC 81 MG tablet Take 1 tablet (81 mg total) by mouth daily. Swallow whole. 02/09/22   Leroy Sea, MD  clopidogrel (PLAVIX) 75 MG tablet Take 1 tablet (75 mg total) by mouth daily. Patient not taking: Reported on 09/25/2022 02/09/22   Leroy Sea, MD  fluticasone [facility]) 50 MCG/ACT nasal spray  Place 1 spray into both nostrils daily. Patient taking differently: Place 1 spray into both nostrils daily as needed for allergies or rhinitis. 09/08/21   Carlisle Beers, FNP  lisinopril (ZESTRIL) 5 MG tablet Take 5 mg by mouth daily. 02/16/22   [provider]  rosuvastatin (CRESTOR) 40 MG tablet Take 1 tablet (40 mg total) by mouth daily. 02/08/22   Leroy Sea, MD  sertraline (ZOLOFT) 25 MG tablet Take 25 mg by mouth in the morning.    [provider]  traZODone (DESYREL) 50 MG tablet Take 50 mg by mouth at bedtime as needed for sleep.    [provider]  linaclotide Karlene Einstein) 145 MCG CAPS capsule Take 145 mcg by mouth daily before breakfast.  10/31/18  [provider]    Family History Family History  Problem Relation Age of Onset   Healthy Mother    Heart disease Father     Social History Social History   Tobacco Use   Smoking status: Never   Smokeless tobacco: Never  Vaping Use   Vaping status: Never Used  Substance Use Topics   Alcohol use: No   Drug use: No     Allergies   Atorvastatin   Review of Systems Review of Systems Per HPI  Physical Exam Triage Vital Signs ED  Triage Vitals  Encounter Vitals Group     BP 01/21/23 1309 (!) 152/89     Systolic BP Percentile --      Diastolic BP Percentile --      Pulse Rate 01/21/23 1309 77     Resp 01/21/23 1309 17     Temp 01/21/23 1309 98.2 F (36.8 C)     Temp Source 01/21/23 1309 Oral     SpO2 01/21/23 1309 97 %     Weight --      Height --      Head Circumference --      Peak Flow --      Pain Score 01/21/23 1310 0     Pain Loc --      Pain Education --      Exclude from Growth Chart --    No data found.  Updated Vital Signs BP (!) 152/89 (BP Location: Right Arm)   Pulse 77   Temp 98.2 F (36.8 C) (Oral)   Resp 17   LMP 11/16/2010   SpO2 97%   Visual Acuity Right Eye Distance:   Left Eye Distance:   Bilateral Distance:    Right Eye Near:   Left  Eye Near:    Bilateral Near:     Physical Exam Vitals and nursing note reviewed.  Constitutional:      Appearance: She is not ill-appearing or toxic-appearing.  HENT:     Head: Normocephalic and atraumatic.     Right Ear: Hearing and external ear normal.     Left Ear: Hearing and external ear normal.     Nose: Nose normal.     Mouth/Throat:     Lips: Pink.  Eyes:     General: Lids are normal. Vision grossly intact. Gaze aligned appropriately.     Extraocular Movements: Extraocular movements intact.     Conjunctiva/sclera: Conjunctivae normal.  Cardiovascular:     Rate and Rhythm: Normal rate and regular rhythm.     Heart sounds: Normal heart sounds, S1 normal and S2 normal.  Pulmonary:     Effort: Pulmonary effort is normal. No respiratory distress.     Breath sounds: Normal breath sounds and air entry.  Musculoskeletal:     Cervical back: Neck supple.  Skin:    General: Skin is warm and dry.     Capillary Refill: Capillary refill takes less than 2 seconds.     Findings: No rash.  Neurological:     General: No focal deficit present.     Mental Status: She is alert and oriented to person, place, and time. Mental status is at baseline.     Cranial Nerves: No dysarthria or facial asymmetry.  Psychiatric:        Mood and Affect: Mood normal.        Speech: Speech normal.        Behavior: Behavior normal.        Thought Content: Thought content normal.        Judgment: Judgment normal.      UC Treatments / Results  Labs (all labs ordered are listed, but only abnormal results are displayed) Labs Reviewed  SARS CORONAVIRUS 2 (TAT 6-24 HRS)    EKG   Radiology No results found.  Procedures Procedures (including critical care time)  Medications Ordered in UC Medications - No data to display  Initial Impression / Assessment and Plan / UC Course  I have reviewed the triage vital signs and the nursing notes.  Pertinent labs & imaging results that were available  during my care of the patient were reviewed by me and considered in my medical decision making (see chart for details).   1.  Contact with and suspected exposure to COVID-19, encounter for screening for COVID-19 COVID-19 testing is pending, patient is a candidate for antiviral therapy, may have antiviral if positive. Currently asymptomatic.  May take over-the-counter therapies if she becomes symptomatic.  Discussed most up-to-date CDC guidelines.  Work note given.  Counseled patient on potential for adverse effects with medications prescribed/recommended today, strict ER and return-to-clinic precautions discussed, patient verbalized understanding.    Final Clinical Impressions(s) / UC Diagnoses   Final diagnoses:  Contact with and (suspected) exposure to covid-19  Encounter for screening for COVID-19     Discharge Instructions      COVID testing is pending, staff will call you if this is positive. Wear a mask for 5 days of symptoms while you are in public, then you may remove your mask. You may go back to work if you do not have a fever for 24 hours without any medicines.  If you develop any new or worsening symptoms or if your symptoms do not start to improve, please return here or follow-up with your primary care provider. If your symptoms are severe, please go to the emergency room.     ED Prescriptions   None    PDMP not reviewed this encounter.   Carlisle Beers, [place] 01/21/23 1343

## 2023-01-22 ENCOUNTER — Telehealth (HOSPITAL_COMMUNITY): Payer: Self-pay | Admitting: Emergency Medicine

## 2023-01-22 LAB — SARS CORONAVIRUS 2 (TAT 6-24 HRS): SARS Coronavirus 2: NEGATIVE

## 2023-01-22 NOTE — Telephone Encounter (Signed)
Pt called wanting to know results of her covid test from yesterday. Used 2 patient identifiers. Pt results were negative. Pt verbalized understanding.

## 2023-06-14 ENCOUNTER — Other Ambulatory Visit (HOSPITAL_COMMUNITY): Payer: Self-pay

## 2023-06-15 ENCOUNTER — Other Ambulatory Visit (HOSPITAL_COMMUNITY): Payer: Self-pay

## 2023-06-22 ENCOUNTER — Other Ambulatory Visit (HOSPITAL_COMMUNITY): Payer: Self-pay

## 2023-08-06 ENCOUNTER — Emergency Department (HOSPITAL_COMMUNITY)
Admission: EM | Admit: 2023-08-06 | Discharge: 2023-08-07 | Disposition: A | Discharge status: Home / Self Care | Payer: Self-pay | Attending: Emergency Medicine | Admitting: Emergency Medicine

## 2023-08-06 DIAGNOSIS — M542 Cervicalgia: Secondary | ICD-10-CM | POA: Insufficient documentation

## 2023-08-06 DIAGNOSIS — Z79899 Other long term (current) drug therapy: Secondary | ICD-10-CM | POA: Insufficient documentation

## 2023-08-06 DIAGNOSIS — Z7982 Long term (current) use of aspirin: Secondary | ICD-10-CM | POA: Insufficient documentation

## 2023-08-06 DIAGNOSIS — I10 Essential (primary) hypertension: Secondary | ICD-10-CM | POA: Insufficient documentation

## 2023-08-06 DIAGNOSIS — E02 Subclinical iodine-deficiency hypothyroidism: Secondary | ICD-10-CM | POA: Insufficient documentation

## 2023-08-06 DIAGNOSIS — R202 Paresthesia of skin: Secondary | ICD-10-CM | POA: Insufficient documentation

## 2023-08-06 NOTE — ED Triage Notes (Signed)
 The pt is c/o  neck pain and numbness in her lt arm all day  she has been out of her meds  for awhile including bp meds and blood thinners

## 2023-08-07 ENCOUNTER — Other Ambulatory Visit: Payer: Self-pay

## 2023-08-07 ENCOUNTER — Other Ambulatory Visit (HOSPITAL_COMMUNITY): Payer: Self-pay

## 2023-08-07 ENCOUNTER — Emergency Department (HOSPITAL_COMMUNITY): Payer: Self-pay

## 2023-08-07 ENCOUNTER — Encounter (HOSPITAL_COMMUNITY): Payer: Self-pay | Admitting: *Deleted

## 2023-08-07 LAB — BASIC METABOLIC PANEL WITH GFR
Anion gap: 15 (ref 5–15)
BUN: 10 mg/dL (ref 6–20)
CO2: 21 mmol/L — ABNORMAL LOW (ref 22–32)
Calcium: 9.2 mg/dL (ref 8.9–10.3)
Chloride: 104 mmol/L (ref 98–111)
Creatinine, Ser: 0.85 mg/dL (ref 0.44–1.00)
GFR, Estimated: >60 mL/min (ref >60)
Glucose, Bld: 105 mg/dL — ABNORMAL HIGH (ref 70–99)
Potassium: 3.7 mmol/L (ref 3.5–5.1)
Sodium: 140 mmol/L (ref 135–145)

## 2023-08-07 LAB — TROPONIN I (HIGH SENSITIVITY): Troponin I (High Sensitivity): 6 ng/L (ref <18)

## 2023-08-07 LAB — CBC
HCT: 42.4 % (ref 36.0–46.0)
Hemoglobin: 14.1 g/dL (ref 12.0–15.0)
MCH: 31.5 pg (ref 26.0–34.0)
MCHC: 33.3 g/dL (ref 30.0–36.0)
MCV: 94.6 fL (ref 80.0–100.0)
Platelets: 396 10*3/uL (ref 150–400)
RBC: 4.48 MIL/uL (ref 3.87–5.11)
RDW: 12.4 % (ref 11.5–15.5)
WBC: 10.2 10*3/uL (ref 4.0–10.5)
nRBC: 0 % (ref 0.0–0.2)

## 2023-08-07 MED ORDER — LOSARTAN POTASSIUM 25 MG PO TABS
25.0000 mg | ORAL_TABLET | Freq: Every day | ORAL | 3 refills | Status: AC
  Filled 2023-08-07 (×2): qty 30, 30d supply, fill #0
  Filled 2023-09-22: qty 30, 30d supply, fill #1
  Filled 2024-01-31: qty 30, 30d supply, fill #2
  Filled 2024-05-13: qty 30, 30d supply, fill #3

## 2023-08-07 MED ORDER — AMLODIPINE BESYLATE 10 MG PO TABS
10.0000 mg | ORAL_TABLET | Freq: Every day | ORAL | 3 refills | Status: AC
  Filled 2023-08-07 (×2): qty 30, 30d supply, fill #0
  Filled 2023-09-22: qty 30, 30d supply, fill #1
  Filled 2024-01-31: qty 30, 30d supply, fill #2
  Filled 2024-05-13: qty 30, 30d supply, fill #3

## 2023-08-07 MED ORDER — ROSUVASTATIN CALCIUM 40 MG PO TABS
40.0000 mg | ORAL_TABLET | Freq: Every day | ORAL | 3 refills | Status: AC
  Filled 2023-08-07 (×2): qty 30, 30d supply, fill #0
  Filled 2023-09-22: qty 30, 30d supply, fill #1
  Filled 2024-01-31: qty 30, 30d supply, fill #2
  Filled 2024-05-13: qty 30, 30d supply, fill #3

## 2023-08-07 NOTE — ED Provider Notes (Signed)
 [facility] Provider Note  CSN: [id number] Arrival date & time: 08/06/23 2350  Chief Complaint(s) lt arm numb and Hypertension  HPI [name] is a 58 y.o. female with a past medical history listed below including hypertension, hyperlipidemia, anxiety, prior right caudate stroke in 2023 here for 1 day of intermittent neck pain and several hours of intermittent left arm tingling.  No associated weakness.  No facial droop.  No visual disturbance.  No difficulty swallowing or speaking.  No associated chest pain or shortness of breath.  No nausea or vomiting.  No headache.  Patient is concerned for possible stroke.  Reports that her prior stroke was associated with neck pain.  On review of records, patient was noted to have facial droop during her prior stroke. This has improved since.  Additionally she reports that she has been out of her medication for the past 3 months due to financial constraints since losing Medicaid.  The history is provided by the patient.    Past Medical History Past Medical History:  Diagnosis Date   Anxiety    Depression    Hyperlipidemia    Hypertension    Subclinical hypothyroidism    Patient Active Problem List   Diagnosis Date Noted   Left facial numbness 02/07/2022   Elevated TSH 08/12/2016   Hypertension    Hyperlipidemia    Anxiety    Depression    Home Medication(s) Prior to Admission medications   Medication Sig Start Date End Date Taking? Authorizing Provider  amLODipine (NORVASC) 10 MG tablet Take 1 tablet (10 mg total) by mouth daily. 08/07/23   Nira Conn, MD  aspirin EC 81 MG tablet Take 1 tablet (81 mg total) by mouth daily. Swallow whole. 02/09/22   Leroy Sea, MD  fluticasone (FLONASE) 50 MCG/ACT nasal spray Place 1 spray into both nostrils daily. Patient taking differently: Place 1 spray into both nostrils daily as needed for allergies or rhinitis. 09/08/21   Carlisle Beers, FNP  losartan (COZAAR) 25 MG tablet Take 1 tablet (25 mg total) by mouth daily. 08/07/23 08/06/24  Nira Conn, MD  rosuvastatin (CRESTOR) 40 MG tablet Take 1 tablet (40 mg total) by mouth daily. 08/07/23   Nira Conn, MD  sertraline (ZOLOFT) 25 MG tablet Take 25 mg by mouth in the morning.    [provider]  traZODone (DESYREL) 50 MG tablet Take 50 mg by mouth at bedtime as needed for sleep.    [provider]  linaclotide Karlene Einstein) 145 MCG CAPS capsule Take 145 mcg by mouth daily before breakfast.  10/31/18  [provider]                                                                                                                                    Allergies Atorvastatin  Review of Systems Review of Systems As noted in  HPI  Physical Exam Vital Signs  I have reviewed the triage vital signs BP (!) 138/94   Pulse 83   Temp 98.1 F (36.7 C)   Resp (!) 28   Ht 5\' 3"  (1.6 m)   Wt 57.3 kg   LMP 11/16/2010   SpO2 98%   BMI 22.38 kg/m   Physical Exam Vitals reviewed.  Constitutional:      General: She is not in acute distress.    Appearance: She is well-developed. She is not diaphoretic.  HENT:     Head: Normocephalic and atraumatic.     Nose: Nose normal.  Eyes:     General: No scleral icterus.       Right eye: No discharge.        Left eye: No discharge.     Conjunctiva/sclera: Conjunctivae normal.     Pupils: Pupils are equal, round, and reactive to light.  Cardiovascular:     Rate and Rhythm: Normal rate and regular rhythm.     Heart sounds: No murmur heard.    No friction rub. No gallop.  Pulmonary:     Effort: Pulmonary effort is normal. No respiratory distress.     Breath sounds: Normal breath sounds. No stridor. No rales.  Abdominal:     General: There is no distension.     Palpations: Abdomen is soft.     Tenderness: There is no abdominal tenderness.  Musculoskeletal:     Cervical back: Normal  range of motion and neck supple. Tenderness present.       Back:  Skin:    General: Skin is warm and dry.     Findings: No erythema or rash.  Neurological:     Mental Status: She is alert and oriented to person, place, and time.     Comments: Mental Status:  Alert and oriented to person, place, and time.  Attention and concentration normal.  Speech clear.  Recent memory is intact  Cranial Nerves:  II Visual Fields: Intact to confrontation. Visual fields intact. III, IV, VI: Pupils equal and reactive to light and near. Full eye movement without nystagmus  V Facial Sensation: Normal. No weakness of masticatory muscles  VII: No facial weakness or asymmetry  VIII Auditory Acuity: Grossly normal  IX/X: The uvula is midline; the palate elevates symmetrically  XI: Normal sternocleidomastoid and trapezius strength  XII: The tongue is midline. No atrophy or fasciculations.   Motor System: Muscle Strength: 5/5 and symmetric in the upper and lower extremities. No pronation or drift.  Muscle Tone: Tone and muscle bulk are normal in the upper and lower extremities.  Coordination: Intact finger-to-nose. No tremor.  Sensation: Intact to light touch. Gait: Routine gait normal.      ED Results and Treatments Labs (all labs ordered are listed, but only abnormal results are displayed) Labs Reviewed  BASIC METABOLIC PANEL WITH GFR - Abnormal; Notable for the following components:      Result Value   CO2 21 (*)    Glucose, Bld 105 (*)    All other components within normal limits  CBC  TROPONIN I (HIGH SENSITIVITY)  EKG  EKG Interpretation Date/Time:  Sunday August 06 2023 23:59:11 EDT Ventricular Rate:  91 PR Interval:  140 QRS Duration:  74 QT Interval:  388 QTC Calculation: 477 R Axis:   85  Text Interpretation: Normal sinus rhythm Normal ECG When compared with ECG  of 25-Sep-2022 17:59, PREVIOUS ECG IS PRESENT Confirmed by Lanaysia Fritchman ([id number]) on 08/07/2023 12:43:06 AM       Radiology DG Chest 2 View Result Date: 08/07/2023 CLINICAL DATA:  Chest pain, left arm and neck pain. EXAM: CHEST - 2 VIEW COMPARISON:  09/25/2022 FINDINGS: Heart and mediastinal contours are within normal limits. No focal opacities or effusions. No acute bony abnormality. Old healed right lower rib fracture. IMPRESSION: No active cardiopulmonary disease. Electronically Signed   By: Kevin  Dover M.D.   On: 08/07/2023 00:21    Medications Ordered in ED Medications - No data to display Procedures Procedures  (including critical care time) Medical Decision Making / ED Course   Medical Decision Making Amount and/or Complexity of Data Reviewed Labs: ordered. Decision-making details documented in ED Course. ECG/medicine tests: ordered and independent interpretation performed. Decision-making details documented in ED Course.    Intermittent left neck/shoulder pain with intermittent left arm tingling. DDx considered. No focal deficits noted on exam.  Given the multiple and intermittent features of her paresthesias, I have low suspicion for CVA.  Especially given her prior workup with reassuring CT angios of the head and neck. Patient does have muscle tenderness to the superior portion of the trapezius which may be contributing to the paresthesias. Neck pain and left arm paresthesias highly atypical for ACS.  EKG without acute ischemic changes.  Troponin several hours after symptoms is negative. Chest x-ray without evidence of pneumonia, pneumothorax, pulmonary edema pleural effusion. No electrolyte derangements noted.  TOC consult for medication assistance.  Home meds sent to [place] [facility].    Final Clinical Impression(s) / ED Diagnoses Final diagnoses:  Paresthesia of arm  Neck pain   The patient appears reasonably screened and/or stabilized for discharge and I  doubt any other medical condition or other EMC requiring further screening, evaluation, or treatment in the ED at this time. I have discussed the findings, Dx and Tx plan with the patient/family who expressed understanding and agree(s) with the plan. Discharge instructions discussed at length. The patient/family was given strict return precautions who verbalized understanding of the instructions. No further questions at time of discharge.  Disposition: Discharge  Condition: Good  ED Discharge Orders          Ordered    amLODipine (NORVASC) 10 MG tablet  Daily        08/07/23 0156    losartan (COZAAR) 25 MG tablet  Daily        08/07/23 0156    rosuvastatin (CRESTOR) 40 MG tablet  Daily        03 /31/25 0156              Follow Up: Drema Halon, FNP [id number] [place] [address] [phone]  Call  to schedule an appointment for close follow up    This chart was dictated using voice recognition software.  Despite best efforts to proofread,  errors can occur which can change the documentation meaning.    Nira Conn, MD 08/07/23 0200

## 2023-08-07 NOTE — ED Notes (Signed)
 The pa has been contacted

## 2023-08-07 NOTE — ED Notes (Signed)
 Pt was able to walk in the hallway to the bathroom and around the nurses station. MD and RN aware

## 2023-09-22 ENCOUNTER — Other Ambulatory Visit (HOSPITAL_COMMUNITY): Payer: Self-pay

## 2024-01-31 ENCOUNTER — Other Ambulatory Visit (HOSPITAL_COMMUNITY): Payer: Self-pay

## 2024-02-02 ENCOUNTER — Other Ambulatory Visit (HOSPITAL_COMMUNITY): Payer: Self-pay

## 2024-03-12 ENCOUNTER — Encounter (HOSPITAL_COMMUNITY): Payer: Self-pay

## 2024-03-12 ENCOUNTER — Ambulatory Visit (HOSPITAL_COMMUNITY): Admission: EM | Admit: 2024-03-12 | Discharge: 2024-03-12 | Disposition: A | Discharge status: Home / Self Care

## 2024-03-12 DIAGNOSIS — I10 Essential (primary) hypertension: Secondary | ICD-10-CM

## 2024-03-12 NOTE — Discharge Instructions (Signed)
-  In the future, if you have a blood pressure >180/100 and a headache/dizziness/vision change/chest pain - head to the ER or call 911. You could be having a stroke. -Follow-up with your PCP as scheduled to discuss blood pressure medications -Make sure to limit salty foods!

## 2024-03-12 NOTE — ED Triage Notes (Signed)
 Pt c/o headaches for past 2 days. Took benadryl  and tylenol  with relief. States needs a work note to return on Thursday.

## 2024-03-12 NOTE — ED Provider Notes (Signed)
 [facility]    CSN: [id number] Arrival date & time: 03/12/24  1416      History   Chief Complaint Chief Complaint  Patient presents with   Letter for School/Work    HPI [name] is a 58 y.o. female presenting with headaches x2 days. History CVA, hypertension, tubal ligation. -Last two days, notes headaches. Throbbing frontal pain. Through it might be her sinuses. Denies headache at the time of this visit; denies blurred vision, dizziness, CP, SOB. -Hypertension: BPs  running 195/90s at home. Recently established with a new PCP. Next appt with them is 03/21/24. Taking amlodipine  and losartan . Has difficulty adhering to a low-salt diet.  HPI  Past Medical History:  Diagnosis Date   Anxiety    Depression    Hyperlipidemia    Hypertension    Subclinical hypothyroidism     Patient Active Problem List   Diagnosis Date Noted   Left facial numbness 02/07/2022   Elevated TSH 08/12/2016   Hypertension    Hyperlipidemia    Anxiety    Depression     Past Surgical History:  Procedure Laterality Date   SHOULDER SURGERY     TUBAL LIGATION      OB History   No obstetric history on file.      Home Medications    Prior to Admission medications   Medication Sig Start Date End Date Taking? Authorizing Provider  amLODipine  (NORVASC ) 10 MG tablet Take 1 tablet (10 mg total) by mouth daily. 08/07/23   Trine Raynell Moder, MD  aspirin  EC 81 MG tablet Take 1 tablet (81 mg total) by mouth daily. Swallow whole. 02/09/22   Singh, Prashant K, MD  fluticasone  (FLONASE ) 50 MCG/ACT nasal spray Place 1 spray into both nostrils daily. Patient taking differently: Place 1 spray into both nostrils daily as needed for allergies or rhinitis. 09/08/21   Enedelia Dorna HERO, FNP  losartan  (COZAAR ) 25 MG tablet Take 1 tablet (25 mg total) by mouth daily. 08/07/23 08/06/24  Trine Raynell Moder, MD  rosuvastatin  (CRESTOR ) 40 MG tablet Take 1 tablet (40 mg total) by mouth daily.  08/07/23   Trine Raynell Moder, MD  linaclotide LARUE) 145 MCG CAPS capsule Take 145 mcg by mouth daily before breakfast.  10/31/18  [provider]    Family History Family History  Problem Relation Age of Onset   Healthy Mother    Heart disease Father     Social History Social History   Tobacco Use   Smoking status: Never   Smokeless tobacco: Never  Vaping Use   Vaping status: Never Used  Substance Use Topics   Alcohol use: No   Drug use: No     Allergies   Atorvastatin   Review of Systems Review of Systems  Neurological:  Positive for headaches.     Physical Exam Triage Vital Signs ED Triage Vitals  Encounter Vitals Group     BP 03/12/24 1525 (!) 153/88     Girls Systolic BP Percentile --      Girls Diastolic BP Percentile --      Boys Systolic BP Percentile --      Boys Diastolic BP Percentile --      Pulse Rate 03/12/24 1525 74     Resp 03/12/24 1525 18     Temp 03/12/24 1525 98.1 F (36.7 C)     Temp Source 03/12/24 1525 Oral     SpO2 03/12/24 1525 98 %     Weight --  Height --      Head Circumference --      Peak Flow --      Pain Score 03/12/24 1521 0     Pain Loc --      Pain Education --      Exclude from Growth Chart --    No data found.  Updated Vital Signs BP (!) 153/88 (BP Location: Left Arm)   Pulse 74   Temp 98.1 F (36.7 C) (Oral)   Resp 18   LMP 11/16/2010   SpO2 98%   Visual Acuity Right Eye Distance:   Left Eye Distance:   Bilateral Distance:    Right Eye Near:   Left Eye Near:    Bilateral Near:     Physical Exam Vitals reviewed.  Constitutional:      General: She is not in acute distress.    Appearance: Normal appearance. She is not ill-appearing.  HENT:     Head: Normocephalic and atraumatic.  Eyes:     Extraocular Movements: Extraocular movements intact.     Pupils: Pupils are equal, round, and reactive to light.  Cardiovascular:     Rate and Rhythm: Normal rate and regular rhythm.      Heart sounds: Normal heart sounds.  Pulmonary:     Effort: Pulmonary effort is normal.     Breath sounds: Normal breath sounds. No wheezing, rhonchi or rales.  Musculoskeletal:     Cervical back: Normal range of motion and neck supple. No rigidity.  Lymphadenopathy:     Cervical: No cervical adenopathy.  Skin:    Capillary Refill: Capillary refill takes less than 2 seconds.  Neurological:     General: No focal deficit present.     Mental Status: She is alert and oriented to person, place, and time. Mental status is at baseline.     Cranial Nerves: No cranial nerve deficit or facial asymmetry.     Sensory: Sensation is intact. No sensory deficit.     Motor: Motor function is intact. No weakness.     Coordination: Coordination is intact. Coordination normal.     Gait: Gait is intact. Gait normal.     Comments: CN 2-12 intact. No weakness or numbness in UEs or LEs.  Psychiatric:        Mood and Affect: Mood normal.        Behavior: Behavior normal.        Thought Content: Thought content normal.        Judgment: Judgment normal.      UC Treatments / Results  Labs (all labs ordered are listed, but only abnormal results are displayed) Labs Reviewed - No data to display  EKG   Radiology No results found.  Procedures Procedures (including critical care time)  Medications Ordered in UC Medications - No data to display  Initial Impression / Assessment and Plan / UC Course  I have reviewed the triage vital signs and the nursing notes.  Pertinent labs & imaging results that were available during my care of the patient were reviewed by me and considered in my medical decision making (see chart for details).     Patient is a pleasant 58 year old female presenting following headache that occurred earlier today.  Her blood pressure has been running high lately, and she has noticed an association between this and her headaches.  At the time of this visit, her blood pressure is  153/88, and she does not have a headache.  Her neuro exam is  reassuring.  She has a primary care follow-up next week.  In the future, she understands that if she is having a headache plus significantly elevated blood pressure, she needs to head to the emergency department or call 911.  Information on DASH diet provided.  Work note provided.  Final Clinical Impressions(s) / UC Diagnoses   Final diagnoses:  Essential hypertension     Discharge Instructions      -In the future, if you have a blood pressure >180/100 and a headache/dizziness/vision change/chest pain - head to the ER or call 911. You could be having a stroke. -Follow-up with your PCP as scheduled to discuss blood pressure medications -Make sure to limit salty foods!     ED Prescriptions   None    PDMP not reviewed this encounter.   Arlyss Leita BRAVO, PA-C 03/12/24 1544

## 2024-03-26 ENCOUNTER — Ambulatory Visit (INDEPENDENT_AMBULATORY_CARE_PROVIDER_SITE_OTHER): Admitting: Primary Care

## 2024-04-03 ENCOUNTER — Encounter (INDEPENDENT_AMBULATORY_CARE_PROVIDER_SITE_OTHER): Payer: Self-pay | Admitting: Primary Care

## 2024-04-03 ENCOUNTER — Ambulatory Visit (INDEPENDENT_AMBULATORY_CARE_PROVIDER_SITE_OTHER): Admitting: Primary Care

## 2024-04-03 VITALS — BP 135/88 | HR 65 | Resp 16 | Ht 63.0 in | Wt 124.6 lb

## 2024-04-03 DIAGNOSIS — E7800 Pure hypercholesterolemia, unspecified: Secondary | ICD-10-CM

## 2024-04-03 DIAGNOSIS — I10 Essential (primary) hypertension: Secondary | ICD-10-CM

## 2024-04-03 DIAGNOSIS — Z7689 Persons encountering health services in other specified circumstances: Secondary | ICD-10-CM

## 2024-04-03 DIAGNOSIS — Z1211 Encounter for screening for malignant neoplasm of colon: Secondary | ICD-10-CM

## 2024-04-03 DIAGNOSIS — Z8673 Personal history of transient ischemic attack (TIA), and cerebral infarction without residual deficits: Secondary | ICD-10-CM

## 2024-04-03 DIAGNOSIS — E039 Hypothyroidism, unspecified: Secondary | ICD-10-CM

## 2024-04-03 DIAGNOSIS — G4700 Insomnia, unspecified: Secondary | ICD-10-CM | POA: Diagnosis not present

## 2024-04-03 DIAGNOSIS — N898 Other specified noninflammatory disorders of vagina: Secondary | ICD-10-CM

## 2024-04-03 DIAGNOSIS — R7309 Other abnormal glucose: Secondary | ICD-10-CM | POA: Diagnosis not present

## 2024-04-03 DIAGNOSIS — N951 Menopausal and female climacteric states: Secondary | ICD-10-CM

## 2024-04-03 DIAGNOSIS — Z1231 Encounter for screening mammogram for malignant neoplasm of breast: Secondary | ICD-10-CM

## 2024-04-03 NOTE — Patient Instructions (Signed)
 Menopause is a natural decline in reproductive hormones in women that reach their 67s to 32s.  This is the absence of a menstrual cycle for 12 months.  Symptoms can include fatigue, night sweats, hot flashes, or sweating.  Also, common features anxiety, dry skin, irritability, moodiness, reduced sex drive, or vaginal dryness.  Black cohosh

## 2024-04-03 NOTE — Progress Notes (Signed)
 New Patient Office Visit  Subjective    Patient ID: [name] female  DOB: 1966/01/16  Age: 58 y.o. MRN: [id number]   CC:  Establish care  HPI     New Patient (Initial Visit)    Additional comments: Establish care  Hypertension  Not sleeping at night  Pt states [name] is going through pre-menopausal or [name] is past menopausal        Last edited by Casimir Juvenal SAUNDERS, RMA on 04/03/2024 10:41 AM.     Tingling left side cervical chain to traps ( anxious because these were the symptom with [name] first stroke) 3 times a week  HPI   Current Outpatient Medications on File Prior to Visit  Medication Sig Dispense Refill   amLODipine  (NORVASC ) 10 MG tablet Take 1 tablet (10 mg total) by mouth daily. 30 tablet 3   aspirin  EC 81 MG tablet Take 1 tablet (81 mg total) by mouth daily. Swallow whole. 30 tablet 12   fluticasone  (FLONASE ) 50 MCG/ACT nasal spray Place 1 spray into both nostrils daily. (Patient taking differently: Place 1 spray into both nostrils daily as needed for allergies or rhinitis.) 16 g 2   losartan  (COZAAR ) 25 MG tablet Take 1 tablet (25 mg total) by mouth daily. 30 tablet 3   rosuvastatin  (CRESTOR ) 40 MG tablet Take 1 tablet (40 mg total) by mouth daily. 30 tablet 3   [DISCONTINUED] linaclotide (LINZESS) 145 MCG CAPS capsule Take 145 mcg by mouth daily before breakfast.     No current facility-administered medications on file prior to visit.     Allergies  Allergen Reactions   Atorvastatin Other (See Comments)    Myalgia- muscle pain     Past Medical History:  Diagnosis Date   Anxiety    Depression    Hyperlipidemia    Hypertension    Subclinical hypothyroidism      Past Surgical History:  Procedure Laterality Date   SHOULDER SURGERY     TUBAL LIGATION       Family History  Problem Relation Age of Onset   Healthy Mother    Heart disease Father     Social History   Socioeconomic History   Marital status: Single    Spouse name: Not on file    Number of children: Not on file   Years of education: Not on file   Highest education level: Not on file  Occupational History   Not on file  Tobacco Use   Smoking status: Never   Smokeless tobacco: Never  Vaping Use   Vaping status: Never Used  Substance and Sexual Activity   Alcohol use: No   Drug use: No   Sexual activity: Not on file  Other Topics Concern   Not on file  Social History Narrative   Not on file   Social Drivers of Health   Financial Resource Strain: Not on file  Food Insecurity: Not on file  Transportation Needs: Not on file  Physical Activity: Not on file  Stress: Not on file  Social Connections: Not on file  Intimate Partner Violence: Not on file       Health Maintenance  Topic Date Due   Hepatitis C Screening  Never done   DTaP/Tdap/Td vaccine (1 - Tdap) Never done   Hepatitis B Vaccine (1 of 3 - 19+ 3-dose series) Never done   Breast Cancer Screening  01/05/2013   Colon Cancer Screening  Never done   Pneumococcal Vaccine for age over  50 (1 of 1 - PCV) Never done   Zoster (Shingles) Vaccine (1 of 2) Never done   Flu Shot  Never done   COVID-19 Vaccine (2 - 2025-26 season) 01/08/2024   Pap with HPV screening  04/06/2027   HIV Screening  Completed   HPV Vaccine  Aged Out   Meningitis B Vaccine  Aged Out    Objective    BP 135/88   Pulse 65   Resp 16   Ht 5' 3 (1.6 m)   Wt 124 lb 9.6 oz (56.5 kg)   LMP 11/16/2010   SpO2 98%   BMI 22.07 kg/m     Physical Exam Vitals reviewed.  Constitutional:      Appearance: Normal appearance.  HENT:     Head: Normocephalic.     Right Ear: Tympanic membrane, ear canal and external ear normal.     Left Ear: Tympanic membrane, ear canal and external ear normal.     Nose: Nose normal.     Mouth/Throat:     Mouth: Mucous membranes are moist.  Eyes:     Extraocular Movements: Extraocular movements intact.     Pupils: Pupils are equal, round, and reactive to light.  Cardiovascular:     Rate  and Rhythm: Normal rate.  Pulmonary:     Effort: Pulmonary effort is normal.     Breath sounds: Normal breath sounds.  Abdominal:     General: Bowel sounds are normal.     Palpations: Abdomen is soft.  Musculoskeletal:        General: Normal range of motion.     Cervical back: Normal range of motion.  Skin:    General: Skin is warm and dry.  Neurological:     Mental Status: [name] is alert and oriented to person, place, and time.  Psychiatric:        Mood and Affect: Mood normal.        Behavior: Behavior normal.        Thought Content: Thought content normal.        Assessment & Plan:   [name] was seen today for new patient (initial visit).  Diagnoses and all orders for this visit:  Encounter to establish care  History of cerebrovascular accident (CVA) from right carotid artery occlusion involving right middle cerebral artery territory -     Lipid panel; Future  Acquired hypothyroidism -     TSH + free T4; Future  Essential hypertension, benign BP goal - < 130/80 Explained that having normal blood pressure is the goal and medications are helping to get to goal and maintain normal blood pressure. DIET: Limit salt intake, read nutrition labels to check salt content, limit fried and high fatty foods  Avoid using multisymptom OTC cold preparations that generally contain sudafed which can rise BP. Consult with pharmacist on best cold relief products to use for persons with HTN EXERCISE Discussed incorporating exercise such as walking - 30 minutes most days of the week and can do in 10 minute intervals    -     CMP14+EGFR; Future   Elevated glucose -     CBC with Differential/Platelet; Future -     Hemoglobin A1c; Future  Pure hypercholesterolemia -     Lipid panel; Future  Colon cancer screening -     Ambulatory referral to Gastroenterology  Encounter for screening mammogram for malignant neoplasm of breast -     MM 3D SCREENING MAMMOGRAM BILATERAL BREAST;  Future  Menopausal hot  flushes 2/2 Vaginal dryness during intercourse Discussed lubrication prior to sexual activities and the natural causes of menopause with the decrease in estrogen production.  Recommended black cohosh     Insomnia, unspecified type -     traZODone  (DESYREL ) 50 MG tablet; Take 0.5-1 tablets (25-50 mg total) by mouth at bedtime as needed for sleep.     Follow-up:  Return for labs  The above assessment and management plan was discussed with the patient. The patient verbalized understanding of and has agreed to the management plan. Patient is aware to call the clinic if symptoms fail to improve or worsen. Patient is aware when to return to the clinic for a follow-up visit. Patient educated on when it is appropriate to go to the emergency department.   Rosaline Bohr, NP-C

## 2024-04-07 MED ORDER — TRAZODONE HCL 50 MG PO TABS: 25.0000 mg | ORAL_TABLET | Freq: Every evening | ORAL | 1 refills | Status: DC | PRN

## 2024-04-22 ENCOUNTER — Other Ambulatory Visit: Payer: Self-pay | Admitting: Family Medicine

## 2024-04-22 DIAGNOSIS — Z1231 Encounter for screening mammogram for malignant neoplasm of breast: Secondary | ICD-10-CM

## 2024-04-23 ENCOUNTER — Other Ambulatory Visit: Payer: Self-pay | Admitting: Primary Care

## 2024-04-23 ENCOUNTER — Inpatient Hospital Stay: Admission: RE | Admit: 2024-04-23 | Discharge: 2024-04-23 | Attending: Family Medicine | Admitting: Family Medicine

## 2024-04-23 DIAGNOSIS — Z1231 Encounter for screening mammogram for malignant neoplasm of breast: Secondary | ICD-10-CM

## 2024-04-30 ENCOUNTER — Ambulatory Visit (INDEPENDENT_AMBULATORY_CARE_PROVIDER_SITE_OTHER): Admitting: Primary Care

## 2024-05-06 ENCOUNTER — Telehealth (INDEPENDENT_AMBULATORY_CARE_PROVIDER_SITE_OTHER): Payer: Self-pay | Admitting: Primary Care

## 2024-05-06 NOTE — Telephone Encounter (Signed)
 Called pt to confirm appt. Pt will be present.

## 2024-05-07 ENCOUNTER — Ambulatory Visit (INDEPENDENT_AMBULATORY_CARE_PROVIDER_SITE_OTHER): Admitting: Primary Care

## 2024-05-13 ENCOUNTER — Other Ambulatory Visit (HOSPITAL_COMMUNITY): Payer: Self-pay

## 2024-05-22 ENCOUNTER — Encounter (INDEPENDENT_AMBULATORY_CARE_PROVIDER_SITE_OTHER): Payer: Self-pay | Admitting: Primary Care

## 2024-05-22 ENCOUNTER — Other Ambulatory Visit: Payer: Self-pay

## 2024-05-22 ENCOUNTER — Other Ambulatory Visit (HOSPITAL_COMMUNITY): Payer: Self-pay

## 2024-05-22 ENCOUNTER — Ambulatory Visit (INDEPENDENT_AMBULATORY_CARE_PROVIDER_SITE_OTHER): Payer: Self-pay | Admitting: Primary Care

## 2024-05-22 VITALS — BP 128/82 | HR 57 | Resp 16 | Ht 63.0 in | Wt 128.8 lb

## 2024-05-22 DIAGNOSIS — G4733 Obstructive sleep apnea (adult) (pediatric): Secondary | ICD-10-CM

## 2024-05-22 DIAGNOSIS — E7800 Pure hypercholesterolemia, unspecified: Secondary | ICD-10-CM

## 2024-05-22 DIAGNOSIS — R7309 Other abnormal glucose: Secondary | ICD-10-CM | POA: Diagnosis not present

## 2024-05-22 DIAGNOSIS — Z8673 Personal history of transient ischemic attack (TIA), and cerebral infarction without residual deficits: Secondary | ICD-10-CM

## 2024-05-22 DIAGNOSIS — E039 Hypothyroidism, unspecified: Secondary | ICD-10-CM

## 2024-05-22 DIAGNOSIS — I10 Essential (primary) hypertension: Secondary | ICD-10-CM | POA: Diagnosis not present

## 2024-05-22 MED ORDER — TRAZODONE HCL 100 MG PO TABS
100.0000 mg | ORAL_TABLET | Freq: Every day | ORAL | 1 refills | Status: AC
  Filled 2024-05-22 (×2): qty 90, 90d supply, fill #0

## 2024-05-22 NOTE — Patient Instructions (Signed)
Placed in [facility] [address] [place], [place] [address] PH# [phone]

## 2024-05-22 NOTE — Progress Notes (Signed)
 " [facility]  [name], is a 59 y.o. female  RDW:[id number]  FMW:[id number]  DOB - Jan 08, 1966  Chief Complaint  Patient presents with   Insomnia       Subjective:    [name] is a 59 y.o. female here today for an acute visit. Patient presents with possible obstructive sleep apnea. Patent has a 6 months history of symptoms of morning fatigue and hypertension. Patient generally gets 3 or 4 hours of sleep per night, and states they generally have difficulty falling asleep, nightime awakenings, and difficulty falling back asleep if awakened. Snoring of mild severity is present. Apneic episodes is present. Nasal obstruction is not present.  Patient does not have had tonsillectomy.  Insomnia she is taking 50 mg of trazodone  which still does not help with resting and sleeping through the night.  Patient tried taking 2 to see what the effects would be and she was able to achieve sleep.  Will increase trazodone  to 100 mg at bedtime.  Okay   HPI  No problems updated.  Comprehensive ROS Pertinent positive and negative noted in HPI   Allergies[1]  Past Medical History:  Diagnosis Date   Anxiety    Depression    Hyperlipidemia    Hypertension    Subclinical hypothyroidism     Medications Ordered Prior to Encounter[2] Health Maintenance  Topic Date Due   Hepatitis C Screening  Never done   DTaP/Tdap/Td vaccine (1 - Tdap) Never done   Hepatitis B Vaccine (1 of 3 - 19+ 3-dose series) Never done   Colon Cancer Screening  Never done   Pneumococcal Vaccine for age over 23 (1 of 1 - PCV) Never done   Zoster (Shingles) Vaccine (1 of 2) Never done   Flu Shot  Never done   COVID-19 Vaccine (2 - 2025-26 season) 01/08/2024   Breast Cancer Screening  04/23/2026   Pap with HPV screening  04/06/2027   HIV Screening  Completed   HPV Vaccine  Aged Out   Meningitis B Vaccine  Aged Out    Objective:  BP 128/82   Pulse (!) 57   Resp 16   Ht 5' 3 (1.6 m)   Wt 128  lb 12.8 oz (58.4 kg)   LMP 11/16/2010   SpO2 98%   BMI 22.82 kg/m   Physical Exam Vitals reviewed.  Constitutional:      Appearance: Normal appearance. She is obese.  HENT:     Head: Normocephalic.     Right Ear: Tympanic membrane, ear canal and external ear normal.     Left Ear: Tympanic membrane, ear canal and external ear normal.     Nose: Nose normal.     Mouth/Throat:     Mouth: Mucous membranes are moist.  Eyes:     Extraocular Movements: Extraocular movements intact.     Pupils: Pupils are equal, round, and reactive to light.  Cardiovascular:     Rate and Rhythm: Normal rate.  Pulmonary:     Effort: Pulmonary effort is normal.     Breath sounds: Normal breath sounds.  Abdominal:     General: Bowel sounds are normal.     Palpations: Abdomen is soft.  Musculoskeletal:        General: Normal range of motion.     Cervical back: Normal range of motion.  Skin:    General: Skin is warm and dry.  Neurological:     Mental Status: She is alert and oriented to person, place, and  time.  Psychiatric:        Mood and Affect: Mood normal.        Behavior: Behavior normal.        Thought Content: Thought content normal.       Assessment & Plan   Assunta was seen today for insomnia.  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      Patient have been counseled extensively about nutrition and exercise. Other issues  discussed during this visit include: low cholesterol diet, weight control and daily exercise, foot care, annual eye examinations at Ophthalmology, importance of adherence with medications and regular follow-up. We also discussed long term complications of uncontrolled diabetes and hypertension.   Return in about 6 weeks (around 07/03/2024) for medication f/u.  The patient was given clear instructions to go to ER or return to medical center if symptoms don't improve, worsen or new problems develop. The patient verbalized understanding. The patient was told to call to get lab results if they haven't heard anything in the next week.   This note has been created with Education officer, environmental. Any transcriptional errors are unintentional.   [name], NP 05/22/2024, 11:25 AM     [1]  Allergies Allergen Reactions   Atorvastatin Other (See Comments)    Myalgia- muscle pain   [2]  Current Outpatient Medications on File Prior to Visit  Medication Sig Dispense Refill   amLODipine  (NORVASC ) 10 MG tablet Take 1 tablet (10 mg total) by mouth daily. 30 tablet 3   aspirin  EC 81 MG tablet Take 1 tablet (81 mg total) by mouth daily. Swallow whole. 30 tablet 12   fluticasone  (FLONASE ) 50 MCG/ACT nasal spray Place 1 spray into both nostrils daily. (Patient taking differently: Place 1 spray into both nostrils daily as needed for allergies or rhinitis.) 16 g 2   losartan  (COZAAR ) 25 MG tablet Take 1 tablet (25 mg total) by mouth daily. 30 tablet 3   rosuvastatin  (CRESTOR ) 40 MG tablet Take 1 tablet (40 mg total) by mouth daily. 30 tablet 3   [DISCONTINUED] linaclotide (LINZESS) 145 MCG CAPS capsule Take 145 mcg by mouth daily before breakfast.     No current facility-administered medications on file prior to visit.   "

## 2024-05-23 ENCOUNTER — Ambulatory Visit (INDEPENDENT_AMBULATORY_CARE_PROVIDER_SITE_OTHER): Payer: Self-pay | Admitting: Primary Care

## 2024-05-23 LAB — CMP14+EGFR
ALT: 25 IU/L (ref 0–32)
AST: 35 IU/L (ref 0–40)
Albumin: 4.5 g/dL (ref 3.8–4.9)
Alkaline Phosphatase: 41 IU/L — ABNORMAL LOW (ref 49–135)
BUN/Creatinine Ratio: 11 (ref 9–23)
BUN: 9 mg/dL (ref 6–24)
Bilirubin Total: 0.3 mg/dL (ref 0.0–1.2)
CO2: 23 mmol/L (ref 20–29)
Calcium: 9.2 mg/dL (ref 8.7–10.2)
Chloride: 105 mmol/L (ref 96–106)
Creatinine, Ser: 0.8 mg/dL (ref 0.57–1.00)
Globulin, Total: 2.2 g/dL (ref 1.5–4.5)
Glucose: 82 mg/dL (ref 70–99)
Potassium: 4.5 mmol/L (ref 3.5–5.2)
Sodium: 146 mmol/L — ABNORMAL HIGH (ref 134–144)
Total Protein: 6.7 g/dL (ref 6.0–8.5)
eGFR: 86 mL/min/1.73

## 2024-05-23 LAB — CBC WITH DIFFERENTIAL/PLATELET
Basophils Absolute: 0 x10E3/uL (ref 0.0–0.2)
Basos: 0 %
EOS (ABSOLUTE): 0.2 x10E3/uL (ref 0.0–0.4)
Eos: 2 %
Hematocrit: 41 % (ref 34.0–46.6)
Hemoglobin: 13.8 g/dL (ref 11.1–15.9)
Immature Grans (Abs): 0 x10E3/uL (ref 0.0–0.1)
Immature Granulocytes: 0 %
Lymphocytes Absolute: 2.3 x10E3/uL (ref 0.7–3.1)
Lymphs: 29 %
MCH: 31.2 pg (ref 26.6–33.0)
MCHC: 33.7 g/dL (ref 31.5–35.7)
MCV: 93 fL (ref 79–97)
Monocytes Absolute: 0.6 x10E3/uL (ref 0.1–0.9)
Monocytes: 8 %
Neutrophils Absolute: 4.8 x10E3/uL (ref 1.4–7.0)
Neutrophils: 61 %
Platelets: 325 x10E3/uL (ref 150–450)
RBC: 4.43 x10E6/uL (ref 3.77–5.28)
RDW: 12.5 % (ref 11.7–15.4)
WBC: 8 x10E3/uL (ref 3.4–10.8)

## 2024-05-23 LAB — LIPID PANEL
Chol/HDL Ratio: 2.9 ratio (ref 0.0–4.4)
Cholesterol, Total: 176 mg/dL (ref 100–199)
HDL: 60 mg/dL
LDL Chol Calc (NIH): 98 mg/dL (ref 0–99)
Triglycerides: 99 mg/dL (ref 0–149)
VLDL Cholesterol Cal: 18 mg/dL (ref 5–40)

## 2024-05-23 LAB — HEMOGLOBIN A1C
Est. average glucose Bld gHb Est-mCnc: 111 mg/dL
Hgb A1c MFr Bld: 5.5 % (ref 4.8–5.6)

## 2024-05-23 LAB — TSH+FREE T4
Free T4: 1.18 ng/dL (ref 0.82–1.77)
TSH: 5.11 u[IU]/mL — ABNORMAL HIGH (ref 0.450–4.500)

## 2024-06-10 ENCOUNTER — Encounter: Payer: Self-pay | Admitting: Gastroenterology

## 2024-06-20 ENCOUNTER — Ambulatory Visit: Admitting: Pulmonary Disease

## 2024-07-03 ENCOUNTER — Ambulatory Visit (INDEPENDENT_AMBULATORY_CARE_PROVIDER_SITE_OTHER): Payer: Self-pay | Admitting: Primary Care
